# Patient Record
Sex: Female | Born: 1995 | Hispanic: No | Marital: Married | Smoking: Never smoker
Health system: Southern US, Community
[De-identification: ages and names within clinical notes are randomized; demographics above are authoritative.]

## PROBLEM LIST (undated history)

## (undated) ENCOUNTER — Ambulatory Visit (HOSPITAL_COMMUNITY): Payer: Medicaid Other

## (undated) ENCOUNTER — Inpatient Hospital Stay (HOSPITAL_COMMUNITY): Payer: Self-pay

## (undated) DIAGNOSIS — N390 Urinary tract infection, site not specified: Secondary | ICD-10-CM

## (undated) DIAGNOSIS — D649 Anemia, unspecified: Secondary | ICD-10-CM

## (undated) DIAGNOSIS — F32A Depression, unspecified: Secondary | ICD-10-CM

## (undated) DIAGNOSIS — K219 Gastro-esophageal reflux disease without esophagitis: Secondary | ICD-10-CM

## (undated) DIAGNOSIS — R002 Palpitations: Secondary | ICD-10-CM

## (undated) DIAGNOSIS — Z789 Other specified health status: Secondary | ICD-10-CM

## (undated) DIAGNOSIS — J302 Other seasonal allergic rhinitis: Secondary | ICD-10-CM

## (undated) HISTORY — PX: NO PAST SURGERIES: SHX2092

---

## 2021-06-16 ENCOUNTER — Other Ambulatory Visit: Payer: Self-pay

## 2021-06-16 ENCOUNTER — Emergency Department (HOSPITAL_COMMUNITY)
Admission: EM | Admit: 2021-06-16 | Discharge: 2021-06-16 | Disposition: A | Discharge status: Home / Self Care | Payer: BC Managed Care – PPO | Attending: Emergency Medicine | Admitting: Emergency Medicine

## 2021-06-16 ENCOUNTER — Encounter (HOSPITAL_COMMUNITY): Payer: Self-pay

## 2021-06-16 DIAGNOSIS — R102 Pelvic and perineal pain: Secondary | ICD-10-CM | POA: Diagnosis not present

## 2021-06-16 DIAGNOSIS — Z3A01 Less than 8 weeks gestation of pregnancy: Secondary | ICD-10-CM | POA: Diagnosis not present

## 2021-06-16 DIAGNOSIS — R5383 Other fatigue: Secondary | ICD-10-CM | POA: Insufficient documentation

## 2021-06-16 DIAGNOSIS — O219 Vomiting of pregnancy, unspecified: Secondary | ICD-10-CM | POA: Diagnosis not present

## 2021-06-16 LAB — URINALYSIS, ROUTINE W REFLEX MICROSCOPIC
Bacteria, UA: NONE SEEN
Bilirubin Urine: NEGATIVE
Glucose, UA: NEGATIVE mg/dL
Leukocytes,Ua: NEGATIVE
Nitrite: NEGATIVE
Protein, ur: NEGATIVE mg/dL
Specific Gravity, Urine: >=1.03 (ref 1.005–1.030)
pH: 6 (ref 5.0–8.0)

## 2021-06-16 LAB — COMPREHENSIVE METABOLIC PANEL
ALT: 9 U/L (ref 0–44)
AST: 15 U/L (ref 15–41)
Albumin: 4 g/dL (ref 3.5–5.0)
Alkaline Phosphatase: 27 U/L — ABNORMAL LOW (ref 38–126)
Anion gap: 9 (ref 5–15)
BUN: 12 mg/dL (ref 6–20)
CO2: 21 mmol/L — ABNORMAL LOW (ref 22–32)
Calcium: 9.3 mg/dL (ref 8.9–10.3)
Chloride: 105 mmol/L (ref 98–111)
Creatinine, Ser: 0.48 mg/dL (ref 0.44–1.00)
GFR, Estimated: >60 mL/min (ref >60)
Glucose, Bld: 88 mg/dL (ref 70–99)
Potassium: 3.2 mmol/L — ABNORMAL LOW (ref 3.5–5.1)
Sodium: 135 mmol/L (ref 135–145)
Total Bilirubin: 0.5 mg/dL (ref 0.3–1.2)
Total Protein: 7.3 g/dL (ref 6.5–8.1)

## 2021-06-16 LAB — CBC WITH DIFFERENTIAL/PLATELET
Abs Immature Granulocytes: 0.01 10*3/uL (ref 0.00–0.07)
Basophils Absolute: 0.1 10*3/uL (ref 0.0–0.1)
Basophils Relative: 1 %
Eosinophils Absolute: 0.2 10*3/uL (ref 0.0–0.5)
Eosinophils Relative: 3 %
HCT: 35.8 % — ABNORMAL LOW (ref 36.0–46.0)
Hemoglobin: 11.5 g/dL — ABNORMAL LOW (ref 12.0–15.0)
Immature Granulocytes: 0 %
Lymphocytes Relative: 35 %
Lymphs Abs: 2.2 10*3/uL (ref 0.7–4.0)
MCH: 26.3 pg (ref 26.0–34.0)
MCHC: 32.1 g/dL (ref 30.0–36.0)
MCV: 81.7 fL (ref 80.0–100.0)
Monocytes Absolute: 0.5 10*3/uL (ref 0.1–1.0)
Monocytes Relative: 8 %
Neutro Abs: 3.3 10*3/uL (ref 1.7–7.7)
Neutrophils Relative %: 53 %
Platelets: 268 10*3/uL (ref 150–400)
RBC: 4.38 MIL/uL (ref 3.87–5.11)
RDW: 12.7 % (ref 11.5–15.5)
WBC: 6.2 10*3/uL (ref 4.0–10.5)
nRBC: 0 % (ref 0.0–0.2)

## 2021-06-16 LAB — LIPASE, BLOOD: Lipase: 39 U/L (ref 11–51)

## 2021-06-16 LAB — HCG, QUANTITATIVE, PREGNANCY: hCG, Beta Chain, Quant, S: 199569 m[IU]/mL — ABNORMAL HIGH (ref <5)

## 2021-06-16 MED ORDER — POTASSIUM CHLORIDE CRYS ER 20 MEQ PO TBCR
40.0000 meq | EXTENDED_RELEASE_TABLET | Freq: Once | ORAL | Status: AC
  Administered 2021-06-16: 40 meq via ORAL
  Filled 2021-06-16: qty 2

## 2021-06-16 MED ORDER — SODIUM CHLORIDE 0.9 % IV BOLUS
1000.0000 mL | Freq: Once | INTRAVENOUS | Status: AC
  Administered 2021-06-16: 1000 mL via INTRAVENOUS

## 2021-06-16 MED ORDER — ONDANSETRON HCL 4 MG/2ML IJ SOLN
4.0000 mg | Freq: Once | INTRAMUSCULAR | Status: AC
Start: 2021-06-16 — End: 2021-06-16
  Administered 2021-06-16: 4 mg via INTRAVENOUS
  Filled 2021-06-16: qty 2

## 2021-06-16 NOTE — ED Provider Notes (Signed)
[facility]-EMERGENCY DEPT Provider Note   CSN: [id number] Arrival date & time: 06/16/21  1651    History Chief Complaint  Patient presents with   Morning Sickness   Everardo All Tyesha Joffe is a 25 y.o. female. G3P2 not currently followed by ObGyn who presents for evaluation of nausea and fatigue. Has been dry heaving for 1 month. Feels very fatigued. Triage note, notes intermittent abd pain however patient denies any current pain. States had some cramping 2 weeks ago which is resolved. No vaginal bleeding, vag discharge.  No fever, chills, chest pain, shortness of breath abdominal pain, pelvic pain, numbness, syncope. Denies additional aggravating or alleviating factors.  History obtained from patient and past medical records. Patient declines medical Arabic interpretor. Request her husband interpret.  LMP 05/02/21  HPI     History reviewed. No pertinent past medical history.  There are no problems to display for this patient.   History reviewed. No pertinent surgical history.   OB History     Gravida  1   Para      Term      Preterm      AB      Living         SAB      IAB      Ectopic      Multiple      Live Births              History reviewed. No pertinent family history.  Social History   Tobacco Use   Smoking status: Never   Smokeless tobacco: Never  Substance Use Topics   Alcohol use: Never   Drug use: Never    Home Medications Prior to Admission medications   Not on File    Allergies    Patient has no known allergies.  Review of Systems   Review of Systems  Constitutional:  Positive for fatigue. Negative for activity change, appetite change, chills, diaphoresis, fever and unexpected weight change.  HENT: Negative.    Respiratory: Negative.    Cardiovascular: Negative.   Gastrointestinal:  Positive for nausea. Negative for abdominal distention, abdominal pain, anal bleeding, blood in stool,  constipation, diarrhea, rectal pain and vomiting.  Genitourinary: Negative.   Musculoskeletal: Negative.   Skin: Negative.   Neurological: Negative.   All other systems reviewed and are negative.  Physical Exam Updated Vital Signs BP (!) 95/59   Pulse 67   Temp 98.2 F (36.8 C) (Oral)   Resp 18   LMP 05/02/2021   SpO2 100%   Physical Exam Vitals and nursing note reviewed.  Constitutional:      General: She is not in acute distress.    Appearance: She is well-developed. She is not ill-appearing, toxic-appearing or diaphoretic.  HENT:     Head: Normocephalic and atraumatic.     Mouth/Throat:     Mouth: Mucous membranes are moist.  Eyes:     Pupils: Pupils are equal, round, and reactive to light.  Cardiovascular:     Rate and Rhythm: Normal rate.     Pulses: Normal pulses.     Heart sounds: Normal heart sounds.  Pulmonary:     Effort: Pulmonary effort is normal. No respiratory distress.     Breath sounds: Normal breath sounds.  Abdominal:     General: Bowel sounds are normal. There is no distension.     Palpations: Abdomen is soft.     Tenderness: There is no abdominal tenderness. There is  no right CVA tenderness, left CVA tenderness, guarding or rebound.  Genitourinary:    Comments: DECLINES Musculoskeletal:        General: Normal range of motion.     Cervical back: Normal range of motion.  Skin:    General: Skin is warm and dry.     Capillary Refill: Capillary refill takes less than 2 seconds.  Neurological:     General: No focal deficit present.     Mental Status: She is alert and oriented to person, place, and time.  Psychiatric:        Mood and Affect: Mood normal.    ED Results / Procedures / Treatments   Labs (all labs ordered are listed, but only abnormal results are displayed) Labs Reviewed  CBC WITH DIFFERENTIAL/PLATELET - Abnormal; Notable for the following components:      Result Value   Hemoglobin 11.5 (*)    HCT 35.8 (*)    All other components  within normal limits  COMPREHENSIVE METABOLIC PANEL - Abnormal; Notable for the following components:   Potassium 3.2 (*)    CO2 21 (*)    Alkaline Phosphatase 27 (*)    All other components within normal limits  HCG, QUANTITATIVE, PREGNANCY - Abnormal; Notable for the following components:   hCG, Beta Chain, Quant, S [id number] (*)    All other components within normal limits  URINALYSIS, ROUTINE W REFLEX MICROSCOPIC - Abnormal; Notable for the following components:   Color, Urine YELLOW (*)    APPearance CLEAR (*)    Hgb urine dipstick SMALL (*)    Ketones, ur TRACE (*)    All other components within normal limits  LIPASE, BLOOD    EKG None  Radiology No results found.  Procedures Procedures   Medications Ordered in ED Medications  sodium chloride 0.9 % bolus 1,000 mL (1,000 mLs Intravenous New Bag/Given (Non-Interop) 06/16/21 2015)  ondansetron (ZOFRAN) injection 4 mg (4 mg Intravenous Given 06/16/21 2017)  potassium chloride SA (KLOR-CON) CR tablet 40 mEq (40 mEq Oral Given 06/16/21 2115)   ED Course  I have reviewed the triage vital signs and the nursing notes.  Pertinent labs & imaging results that were available during my care of the patient were reviewed by me and considered in my medical decision making (see chart for details).  G3 P2 approximately [redacted]w[redacted]d preg by LMP since for evaluation of generalized fatigue and nausea over the last 3 weeks.  Afebrile, nonseptic, not ill-appearing.  Denies any current abdominal pain, vaginal bleeding, fluid leakage. Initially had cramping 2 weeks ago however resolved.  No urinary complaints.  Not currently followed by OB.  Labs personally reviewed and interpreted:  CBC without leukocytosis, hemoglobin 11.5 Lipase 39 Metabolic panel with potassium 3.2, will supplement UA negative for infection hCG [id number] [place] >>> DECLINED  Patient reassessed.  Nausea improved.  Requesting drink.  Will p.o. challenge.  Discussed a pelvic exam and  ultrasound given she had some earlier cramping.  Patient declines any ultrasound or pelvic exam at this time.  States they prefer to follow-up with OB/GYN.  I had long discussion with husband and patient in room with interpreter discussed cannot rule out ectopic pregnancy, complication of pregnancy without ultrasound.  They voiced understanding however continued to decline.  Reassessed. Tolerated PO intake. Will dc home with close FU with ObGYn. Dc home with Unasom and B6 rec for nausea, rest and push fluids. Discussed strict return precautions. Patient agreeable.  The patient has been appropriately medically screened and/or  stabilized in the ED. I have low suspicion for any other emergent medical condition which would require further screening, evaluation or treatment in the ED or require inpatient management.  Patient is hemodynamically stable and in no acute distress.  Patient able to ambulate in department prior to ED.  Evaluation does not show acute pathology that would require ongoing or additional emergent interventions while in the emergency department or further inpatient treatment.  I have discussed the diagnosis with the patient and answered all questions.  Pain is been managed while in the emergency department and patient has no further complaints prior to discharge.  Patient is comfortable with plan discussed in room and is stable for discharge at this time.  I have discussed strict return precautions for returning to the emergency department.  Patient was encouraged to follow-up with PCP/specialist refer to at discharge.     MDM Rules/Calculators/A&P                            Final Clinical Impression(s) / ED Diagnoses Final diagnoses:  Nausea/vomiting in pregnancy    Rx / DC Orders ED Discharge Orders          Ordered    Ambulatory referral to Obstetrics / Gynecology        06/16/21 2103             Linwood Dibbles, PA-C 06/16/21 2131    Charlynne Pander,  MD 06/16/21 2333

## 2021-06-16 NOTE — ED Provider Notes (Signed)
Emergency Medicine Provider Triage Evaluation Note  Melanie [name] , a 25 y.o. female  was evaluated in triage.  Pt complains of nausea and fatigue x3 weeks.  Patient is currently 1 month pregnant.  Husband at bedside translated.  No vaginal bleeding.  Intermittent abdominal pain.  She has not seen an OB/GYN during this pregnancy yet. G3P2.  No emesis.  No fever or chills.  No urinary symptoms.  Review of Systems  Positive: Nausea, fatigue Negative: Vaginal bleeding  Physical Exam  BP 105/62 (BP Location: Left Arm)   Pulse 89   Temp 98.2 F (36.8 C) (Oral)   Resp 18   LMP 05/02/2021   SpO2 97%  Gen:   Awake, no distress   Resp:  Normal effort  MSK:   Moves extremities without difficulty  Other:  Abdomen soft, nondistended, nontender to palpation in all quadrants without guarding or peritoneal signs. No rebound.   Medical Decision Making  Medically screening exam initiated at 5:27 PM.  Appropriate orders placed.  Melanie [name] was informed that the remainder of the evaluation will be completed by another provider, this initial triage assessment does not replace that evaluation, and the importance of remaining in the ED until their evaluation is complete.  labs   Melanie [name] 06/16/21 1728    Melanie Score, MD 06/16/21 [phone]

## 2021-06-16 NOTE — ED Notes (Signed)
Pt given water and crackers for PO challenge

## 2021-06-16 NOTE — Discharge Instructions (Addendum)
???????.  ???????? ?? ???? ??????. ??? ????? ????? ?? ?????????. ?? ??????? ?? ????? ?????? ?????? ?????? ????.  ?????? ??????? ??????? ?? ?????????   tanawal al'adwiat ealaa alnahw almusufa. almutabaeat mae tabib alnisa'i. laqad 'ujriat 'iihalatan fi alkumbiutar. min almuftarad 'an tatalaqaa mukalamatan hatifiatan litahdid maweidi. aleawdat lil'aerad aljadidat 'aw almutafaqima  Take the medications as prescribed.  Follow up with an Obgyn. I have placed a referal in the computer. You should be getting a phone call to schedule an appointment.  Return for new or worsening symptoms  Vitamin B6>> 10 mg up to 3 times daily Unisom 12.5 mg (1/2 tablet) at bedtime

## 2021-06-16 NOTE — ED Triage Notes (Signed)
Pt arrives with her husband. She requests for him to interpret for her. Pt reports nausea and fatigue for the past 3 weeks. She reports she about 1 month pregnant.

## 2021-06-21 ENCOUNTER — Other Ambulatory Visit: Payer: Self-pay

## 2021-06-21 ENCOUNTER — Emergency Department (HOSPITAL_COMMUNITY)
Admission: EM | Admit: 2021-06-21 | Discharge: 2021-06-22 | Disposition: A | Discharge status: Home / Self Care | Payer: BC Managed Care – PPO | Attending: Emergency Medicine | Admitting: Emergency Medicine

## 2021-06-21 DIAGNOSIS — O219 Vomiting of pregnancy, unspecified: Secondary | ICD-10-CM | POA: Insufficient documentation

## 2021-06-21 DIAGNOSIS — Z3A01 Less than 8 weeks gestation of pregnancy: Secondary | ICD-10-CM | POA: Diagnosis not present

## 2021-06-21 DIAGNOSIS — Z5321 Procedure and treatment not carried out due to patient leaving prior to being seen by health care provider: Secondary | ICD-10-CM | POA: Insufficient documentation

## 2021-06-21 LAB — COMPREHENSIVE METABOLIC PANEL
ALT: 10 U/L (ref 0–44)
AST: 16 U/L (ref 15–41)
Albumin: 4 g/dL (ref 3.5–5.0)
Alkaline Phosphatase: 30 U/L — ABNORMAL LOW (ref 38–126)
Anion gap: 6 (ref 5–15)
BUN: 12 mg/dL (ref 6–20)
CO2: 23 mmol/L (ref 22–32)
Calcium: 9.2 mg/dL (ref 8.9–10.3)
Chloride: 105 mmol/L (ref 98–111)
Creatinine, Ser: 0.75 mg/dL (ref 0.44–1.00)
GFR, Estimated: >60 mL/min (ref >60)
Glucose, Bld: 126 mg/dL — ABNORMAL HIGH (ref 70–99)
Potassium: 3.2 mmol/L — ABNORMAL LOW (ref 3.5–5.1)
Sodium: 134 mmol/L — ABNORMAL LOW (ref 135–145)
Total Bilirubin: 0.6 mg/dL (ref 0.3–1.2)
Total Protein: 7.2 g/dL (ref 6.5–8.1)

## 2021-06-21 LAB — CBC WITH DIFFERENTIAL/PLATELET
Abs Immature Granulocytes: 0.02 10*3/uL (ref 0.00–0.07)
Basophils Absolute: 0.1 10*3/uL (ref 0.0–0.1)
Basophils Relative: 1 %
Eosinophils Absolute: 0.1 10*3/uL (ref 0.0–0.5)
Eosinophils Relative: 3 %
HCT: 34.5 % — ABNORMAL LOW (ref 36.0–46.0)
Hemoglobin: 11.1 g/dL — ABNORMAL LOW (ref 12.0–15.0)
Immature Granulocytes: 0 %
Lymphocytes Relative: 29 %
Lymphs Abs: 1.6 10*3/uL (ref 0.7–4.0)
MCH: 26.6 pg (ref 26.0–34.0)
MCHC: 32.2 g/dL (ref 30.0–36.0)
MCV: 82.5 fL (ref 80.0–100.0)
Monocytes Absolute: 0.4 10*3/uL (ref 0.1–1.0)
Monocytes Relative: 7 %
Neutro Abs: 3.4 10*3/uL (ref 1.7–7.7)
Neutrophils Relative %: 60 %
Platelets: 269 10*3/uL (ref 150–400)
RBC: 4.18 MIL/uL (ref 3.87–5.11)
RDW: 12.7 % (ref 11.5–15.5)
WBC: 5.7 10*3/uL (ref 4.0–10.5)
nRBC: 0 % (ref 0.0–0.2)

## 2021-06-21 NOTE — ED Provider Notes (Signed)
Emergency Medicine Provider Triage Evaluation Note  Melanie [name] , a 25 y.o. female  was evaluated in triage.  Pt complains of creased nausea.  Of note patient is 5 or [redacted] weeks pregnant.  Has been unable to set up appointment with OB GYN, called to make an appointment but was sent here instead.  Patient's husband who acts as Nurse, learning disability states that patient has been unable to eat for several days that she has had several episodes of vomiting.  Review of Systems  Positive: Nausea, vomiting Negative: Fevers, chills, chest pain, shortness of breath  Physical Exam  BP 95/64 (BP Location: Left Arm)   Pulse 78   Temp 97.8 F (36.6 C) (Oral)   Resp 16   Ht 5\' 1"  (1.549 m)   Wt 47.1 kg   LMP 05/02/2021   SpO2 100%   BMI 19.61 kg/m  Gen:   Awake, no distress  Resp:  Normal effort MSK:   Moves extremities without difficulty    Medical Decision Making  Medically screening exam initiated at 6:33 PM.  Appropriate orders placed.  05/04/2021 was informed that the remainder of the evaluation will be completed by another provider, this initial triage assessment does not replace that evaluation, and the importance of remaining in the ED until their evaluation is complete.     [name] 06/21/21 06/23/21, MD 06/22/21 [phone]

## 2021-06-21 NOTE — ED Triage Notes (Signed)
Pt requests husband to be her interpreter. Pt reports increased nausea and vomiting with her pregnancy. Pt was seen on the 11th for the same. She is approximately 5-6 weeks pregant.

## 2021-06-22 NOTE — ED Notes (Signed)
Pt left with husband and 2 children prior to being seen by physician

## 2021-07-22 ENCOUNTER — Other Ambulatory Visit: Payer: Self-pay

## 2021-07-22 ENCOUNTER — Ambulatory Visit (INDEPENDENT_AMBULATORY_CARE_PROVIDER_SITE_OTHER): Payer: BC Managed Care – PPO

## 2021-07-22 VITALS — BP 101/69 | HR 91 | Temp 98.0°F | Wt 104.6 lb

## 2021-07-22 DIAGNOSIS — O219 Vomiting of pregnancy, unspecified: Secondary | ICD-10-CM

## 2021-07-22 DIAGNOSIS — Z3480 Encounter for supervision of other normal pregnancy, unspecified trimester: Secondary | ICD-10-CM

## 2021-07-22 DIAGNOSIS — Z3490 Encounter for supervision of normal pregnancy, unspecified, unspecified trimester: Secondary | ICD-10-CM | POA: Insufficient documentation

## 2021-07-22 DIAGNOSIS — Z3481 Encounter for supervision of other normal pregnancy, first trimester: Secondary | ICD-10-CM

## 2021-07-22 DIAGNOSIS — Z3491 Encounter for supervision of normal pregnancy, unspecified, first trimester: Secondary | ICD-10-CM

## 2021-07-22 DIAGNOSIS — Z3A11 11 weeks gestation of pregnancy: Secondary | ICD-10-CM

## 2021-07-22 DIAGNOSIS — R300 Dysuria: Secondary | ICD-10-CM

## 2021-07-22 LAB — POCT URINALYSIS DIPSTICK OB
Glucose, UA: NEGATIVE
Nitrite, UA: NEGATIVE
Spec Grav, UA: >=1.03 — AB (ref 1.010–1.025)
Urobilinogen, UA: 0.2 E.U./dL
pH, UA: 6 (ref 5.0–8.0)

## 2021-07-22 LAB — HEPATITIS C ANTIBODY: HCV Ab: NEGATIVE

## 2021-07-22 MED ORDER — PREPLUS 27-1 MG PO TABS: 1.0000 | ORAL_TABLET | Freq: Every day | ORAL | 13 refills | Status: DC

## 2021-07-22 MED ORDER — PROMETHAZINE HCL 25 MG PO TABS: 25.0000 mg | ORAL_TABLET | Freq: Four times a day (QID) | ORAL | 0 refills | Status: DC | PRN

## 2021-07-22 NOTE — Progress Notes (Signed)
    I discussed the limitations, risks, security and privacy concerns of performing an evaluation and management service by telephone and the availability of in person appointments. I also discussed with the patient that there may be a patient responsible charge related to this service. The patient expressed understanding and agreed to proceed.   Location: [facility]  Patient: clinic Provider: clinic Interpreter used: a video interpreter ANM (Husam [id number])  PRENATAL INTAKE SUMMARY  Ms. Melanie [name] presents today New OB Nurse Interview.  OB History     Gravida  3   Para  2   Term  2   Preterm      AB      Living  2      SAB      IAB      Ectopic      Multiple      Live Births  2          I have reviewed the patient's medical, obstetrical, social, and family histories, medications, and available lab results.  SUBJECTIVE She complains of burning while urinating, and Nausea and Vomiting   OBJECTIVE Initial Nurse interview for history/labs (New OB)  Vital signs were reviewed and unremarkable.  last menstrual period date: 05/02/2021 EDD: 02/06/2022  GA: [redacted]w[redacted]d [id number] FHT: 155  GENERAL APPEARANCE: alert, well appearing, in no apparent distress, oriented to person, place and time   ASSESSMENT Normal pregnancy  PLAN Prenatal care:  [facility] Pnl/HIV/Hep C OB Urine Culture/disptick GC/CT will obtain at next visit with Provider (08/07/2021) PAPpatient has never had a pap test  A1C Declined genetic screening Phenergan and Prenatal sent to pharmacy    Blood Pressure Monitor/Weight Scale In person visits    MyChart/Babyscripts In person visits  Placed OB Box on problem list and updated   Followup with Gerrit Heck, CNM 08/07/2021  Follow Up Instructions:   I discussed the assessment and treatment plan with the patient. The patient was provided an opportunity to ask questions and all were answered. The patient agreed with the plan and  demonstrated an understanding of the instructions.   The patient was advised to call back or seek an in-person evaluation if the symptoms worsen or if the condition fails to improve as anticipated.  I provided 40 minutes of  face-to-face time during this encounter.  Gearldine Shown, CMA

## 2021-07-23 LAB — OBSTETRIC PANEL, INCLUDING HIV
Antibody Screen: NEGATIVE
Basophils Absolute: 0.1 10*3/uL (ref 0.0–0.2)
Basos: 1 %
EOS (ABSOLUTE): 0.2 10*3/uL (ref 0.0–0.4)
Eos: 4 %
HIV Screen 4th Generation wRfx: NONREACTIVE
Hematocrit: 35.3 % (ref 34.0–46.6)
Hemoglobin: 11.5 g/dL (ref 11.1–15.9)
Hepatitis B Surface Ag: NEGATIVE
Immature Grans (Abs): 0 10*3/uL (ref 0.0–0.1)
Immature Granulocytes: 0 %
Lymphocytes Absolute: 1.7 10*3/uL (ref 0.7–3.1)
Lymphs: 35 %
MCH: 26.4 pg — ABNORMAL LOW (ref 26.6–33.0)
MCHC: 32.6 g/dL (ref 31.5–35.7)
MCV: 81 fL (ref 79–97)
Monocytes Absolute: 0.3 10*3/uL (ref 0.1–0.9)
Monocytes: 7 %
Neutrophils Absolute: 2.5 10*3/uL (ref 1.4–7.0)
Neutrophils: 53 %
Platelets: 248 10*3/uL (ref 150–450)
RBC: 4.35 x10E6/uL (ref 3.77–5.28)
RDW: 12.9 % (ref 11.7–15.4)
RPR Ser Ql: NONREACTIVE
Rh Factor: POSITIVE
Rubella Antibodies, IGG: 3.65 index (ref >0.99)
WBC: 4.7 10*3/uL (ref 3.4–10.8)

## 2021-07-23 LAB — HEMOGLOBIN A1C
Est. average glucose Bld gHb Est-mCnc: 105 mg/dL
Hgb A1c MFr Bld: 5.3 % (ref 4.8–5.6)

## 2021-07-23 LAB — HEPATITIS C ANTIBODY: Hep C Virus Ab: <0.1 s/co ratio (ref 0.0–0.9)

## 2021-07-26 LAB — URINE CULTURE, OB REFLEX

## 2021-07-26 LAB — CULTURE, OB URINE

## 2021-07-28 ENCOUNTER — Other Ambulatory Visit: Payer: Self-pay

## 2021-07-28 DIAGNOSIS — R8271 Bacteriuria: Secondary | ICD-10-CM | POA: Insufficient documentation

## 2021-07-28 DIAGNOSIS — Z8619 Personal history of other infectious and parasitic diseases: Secondary | ICD-10-CM | POA: Insufficient documentation

## 2021-07-28 MED ORDER — AMOXICILLIN 500 MG PO CAPS: 500.0000 mg | ORAL_CAPSULE | Freq: Three times a day (TID) | ORAL | 0 refills | Status: AC

## 2021-07-31 ENCOUNTER — Telehealth: Payer: Self-pay | Admitting: *Deleted

## 2021-07-31 NOTE — Telephone Encounter (Signed)
-----   Message from Gerrit Heck, [place] sent at 07/28/2021  6:29 AM EDT ----- Please call patient and inform of results.  Rx sent to pharmacy on file.   Thanks, JE

## 2021-07-31 NOTE — Telephone Encounter (Signed)
Patient called regarding culture results. DOB verified. Advised of +UTI (GBS bacteriuria). Amoxicillin 500 mg capsule TID PO x 7 days. Advised to complete full course of medication.    Interpreter: [place] ID [id number]  Clovis Pu, RN

## 2021-08-07 ENCOUNTER — Other Ambulatory Visit: Payer: Self-pay

## 2021-08-07 ENCOUNTER — Other Ambulatory Visit (HOSPITAL_COMMUNITY)
Admission: RE | Admit: 2021-08-07 | Discharge: 2021-08-07 | Disposition: A | Discharge status: Home / Self Care | Payer: BC Managed Care – PPO | Source: Ambulatory Visit

## 2021-08-07 ENCOUNTER — Ambulatory Visit (INDEPENDENT_AMBULATORY_CARE_PROVIDER_SITE_OTHER): Payer: BC Managed Care – PPO

## 2021-08-07 VITALS — BP 103/71 | HR 93 | Temp 97.7°F | Wt 110.8 lb

## 2021-08-07 DIAGNOSIS — Z124 Encounter for screening for malignant neoplasm of cervix: Secondary | ICD-10-CM

## 2021-08-07 DIAGNOSIS — Z3A13 13 weeks gestation of pregnancy: Secondary | ICD-10-CM | POA: Diagnosis not present

## 2021-08-07 DIAGNOSIS — Z3480 Encounter for supervision of other normal pregnancy, unspecified trimester: Secondary | ICD-10-CM

## 2021-08-07 DIAGNOSIS — Z3A38 38 weeks gestation of pregnancy: Secondary | ICD-10-CM | POA: Diagnosis not present

## 2021-08-07 DIAGNOSIS — Z3481 Encounter for supervision of other normal pregnancy, first trimester: Secondary | ICD-10-CM | POA: Insufficient documentation

## 2021-08-07 DIAGNOSIS — Z789 Other specified health status: Secondary | ICD-10-CM

## 2021-08-07 DIAGNOSIS — R8271 Bacteriuria: Secondary | ICD-10-CM | POA: Diagnosis not present

## 2021-08-07 DIAGNOSIS — O99013 Anemia complicating pregnancy, third trimester: Secondary | ICD-10-CM | POA: Diagnosis not present

## 2021-08-07 NOTE — Progress Notes (Signed)
Subjective:   Melanie [name] is a 25 y.o. [id number] at [redacted]w[redacted]d by Unsure LMP of May 02, 2021 being seen today for her first obstetrical visit.  Her obstetrical history is unremarkable per patient report. Patient does intend to breast feed. Pregnancy history fully reviewed.  Patient reports no complaints.  HISTORY: OB History  Gravida Para Term Preterm AB Living  3 2 2  0 0 2  SAB IAB Ectopic Multiple Live Births  0 0 0 0 2    # Outcome Date GA Lbr Len/2nd Weight Sex Delivery Anes PTL Lv  3 Current           2 Term           1 Term            No past medical history on file. No past surgical history on file. Family History  Problem Relation Age of Onset   Liver disease Mother    Hypertension Father    Social History   Tobacco Use   Smoking status: Never   Smokeless tobacco: Never  Vaping Use   Vaping Use: Never used  Substance Use Topics   Alcohol use: Never   Drug use: Never   No Known Allergies Current Outpatient Medications on File Prior to Visit  Medication Sig Dispense Refill   Prenatal Vit-Fe Fumarate-FA (PREPLUS) 27-1 MG TABS Take 1 tablet by mouth daily. 30 tablet 13   No current facility-administered medications on file prior to visit.    Indications for ASA therapy (per uptodate) One of the following: Previous pregnancy with preeclampsia, especially early onset and with an adverse outcome No Multifetal gestation No Chronic hypertension No Type 1 or 2 diabetes mellitus No Chronic kidney disease No Autoimmune disease (antiphospholipid syndrome, systemic lupus erythematosus) No  Two or more of the following: Nulliparity No Obesity (body mass index >30 kg/m2) No Family history of preeclampsia in mother or sister No Age ?35 years No Sociodemographic characteristics (African American race, low socioeconomic level) No Personal risk factors (eg, previous pregnancy with low birth weight or small for gestational age infant, previous adverse  pregnancy outcome [eg, stillbirth], interval >10 years between pregnancies) No  Indications for early 1 hour GTT (per uptodate)  BMI >25 (>23 in Asian women) AND one of the following  Gestational diabetes mellitus in a previous pregnancy No Glycated hemoglobin ?5.7 percent (39 mmol/mol), impaired glucose tolerance, or impaired fasting glucose on previous testing No First-degree relative with diabetes No High-risk race/ethnicity (eg, African American, Latino, Native American, American, Pacific Islander) No History of cardiovascular disease No Hypertension or on therapy for hypertension No High-density lipoprotein cholesterol level <35 mg/dL ([place] mmol/L) and/or a triglyceride level >250 mg/dL (9.21 mmol/L) No Polycystic ovary syndrome No Physical inactivity No Other clinical condition associated with insulin resistance (eg, severe obesity, acanthosis nigricans) No Previous birth of an infant weighing ?4000 g No Previous stillbirth of unknown cause No   Exam   Vitals:   08/07/21 1059  BP: 103/71  Pulse: 93  Temp: 97.7 F (36.5 C)  Weight: 110 lb 12.8 oz (50.3 kg)   Fetal Heart Rate (bpm): 146  Uterus:     Pelvic Exam: Perineum: no hemorrhoids, normal perineum   Vulva: normal external genitalia, no lesions, Mucoid discharge noted   Vagina:  normal mucosa, Copious amt mucoid discharge-seminal fluid   Cervix: no lesions and normal, pap smear done.    Adnexa: normal adnexa and no mass, fullness, tenderness. Uterine size c/w  dates   Bony Pelvis: average  System: General: well-developed, well-nourished female in no acute distress   Breast:  Not assessed   Skin: normal coloration and turgor, no rashes   Neurologic: oriented, normal, negative, normal mood   Extremities: normal strength, tone, and muscle mass, ROM of all joints is normal   HEENT PERRLA, extraocular movement intact and sclera clear, anicteric   Mouth/Teeth Not Assessed   Neck Hijab in place, Not assessed    Cardiovascular: regular rate and rhythm   Respiratory:  no respiratory distress, normal breath sounds   Abdomen: soft, non-tender; bowel sounds normal; no masses,  no organomegaly     Assessment:   Pregnancy: [id number] Patient Active Problem List   Diagnosis Date Noted   GBS bacteriuria 07/28/2021   Encounter for supervision of normal pregnancy 07/22/2021     Plan:  1. Supervision of other normal pregnancy, antepartum -Reviewed prenatal schedule -Discussed hospital location and when to report.  - Cytology - PAP( [place])  2. Pap smear for cervical cancer screening -Completed - Cytology - PAP( [place])  3. Language barrier affecting health care -Interpretations completed with assistance of video interpreter [place]. -Live interpreter arrives 3/4 way into visit and present for exam. Salma  4. [redacted] weeks gestation of pregnancy -Doing well.   Initial labs reviewed. Continue prenatal vitamins. Genetic Screening discussed, Quad screen and NIPS: declined. Ultrasound discussed; fetal anatomic survey: ordered. Problem list reviewed and updated. The nature of [place] with multiple MDs and other Advanced Practice Providers was explained to patient; also emphasized that residents, students are part of our team. Routine obstetric precautions reviewed. No follow-ups on file.   Maryann Conners 11:09 AM 08/07/21

## 2021-08-09 LAB — CYTOLOGY - PAP
Chlamydia: NEGATIVE
Comment: NEGATIVE
Comment: NEGATIVE
Comment: NORMAL
Diagnosis: NEGATIVE
Neisseria Gonorrhea: NEGATIVE
Trichomonas: NEGATIVE

## 2021-09-04 ENCOUNTER — Other Ambulatory Visit: Payer: Self-pay

## 2021-09-04 ENCOUNTER — Ambulatory Visit (INDEPENDENT_AMBULATORY_CARE_PROVIDER_SITE_OTHER): Payer: BC Managed Care – PPO | Admitting: Advanced Practice Midwife

## 2021-09-04 ENCOUNTER — Encounter: Payer: Self-pay | Admitting: Advanced Practice Midwife

## 2021-09-04 VITALS — BP 94/56 | HR 93 | Temp 98.3°F | Wt 114.2 lb

## 2021-09-04 DIAGNOSIS — Z3A17 17 weeks gestation of pregnancy: Secondary | ICD-10-CM

## 2021-09-04 DIAGNOSIS — J302 Other seasonal allergic rhinitis: Secondary | ICD-10-CM

## 2021-09-04 DIAGNOSIS — Z3480 Encounter for supervision of other normal pregnancy, unspecified trimester: Secondary | ICD-10-CM

## 2021-09-04 DIAGNOSIS — R1011 Right upper quadrant pain: Secondary | ICD-10-CM

## 2021-09-04 DIAGNOSIS — O26899 Other specified pregnancy related conditions, unspecified trimester: Secondary | ICD-10-CM

## 2021-09-04 MED ORDER — LORATADINE 10 MG PO TABS: 10.0000 mg | ORAL_TABLET | Freq: Every day | ORAL | 1 refills | Status: DC

## 2021-09-04 NOTE — Progress Notes (Signed)
   PRENATAL VISIT NOTE  Subjective:  Melanie [name] is a 25 y.o. G3P2002 at [redacted]w[redacted]d being seen today for ongoing prenatal care.  She is currently monitored for the following issues for this low-risk pregnancy and has Encounter for supervision of normal pregnancy and GBS bacteriuria on their problem list.  Patient reports no complaints.  Contractions: Not present. Vag. Bleeding: None.  Movement: Absent. Denies leaking of fluid.   The following portions of the patient's history were reviewed and updated as appropriate: allergies, current medications, past family history, past medical history, past social history, past surgical history and problem list.   Objective:   Vitals:   09/04/21 1452  BP: (!) 94/56  Pulse: 93  Temp: 98.3 F (36.8 C)  Weight: 51.8 kg    Fetal Status: Fetal Heart Rate (bpm): 148   Movement: Absent     General:  Alert, oriented and cooperative. Patient is in no acute distress.  Skin: Skin is warm and dry. No rash noted.   Cardiovascular: Normal heart rate noted  Respiratory: Normal respiratory effort, no problems with respiration noted  Abdomen: Soft, gravid, appropriate for gestational age.  Pain/Pressure: Absent     Pelvic: Cervical exam deferred        Extremities: Normal range of motion.  Edema: None  Mental Status: Normal mood and affect. Normal behavior. Normal judgment and thought content.   Assessment and Plan:  Pregnancy: G3P2002 at [redacted]w[redacted]d 1. Supervision of other normal pregnancy, antepartum   2. [redacted] weeks gestation of pregnancy   Preterm labor symptoms and general obstetric precautions including but not limited to vaginal bleeding, contractions, leaking of fluid and fetal movement were reviewed in detail with the patient. Please refer to After Visit Summary for other counseling recommendations.   Return in about 4 weeks (around 10/02/2021).  Future Appointments  Date Time Provider Department Center  09/12/2021 10:15 AM [facility]  [facility]  10/02/2021  8:55 AM Raelyn Mora, CNM [facility] None   Thressa Sheller DNP, CNM  09/04/21  3:29 PM

## 2021-09-05 LAB — COMPREHENSIVE METABOLIC PANEL
ALT: 8 IU/L (ref 0–32)
AST: 17 IU/L (ref 0–40)
Albumin/Globulin Ratio: 1.5 (ref 1.2–2.2)
Albumin: 3.7 g/dL — ABNORMAL LOW (ref 3.9–5.0)
Alkaline Phosphatase: 40 IU/L — ABNORMAL LOW (ref 44–121)
BUN/Creatinine Ratio: 15 (ref 9–23)
BUN: 6 mg/dL (ref 6–20)
Bilirubin Total: <0.2 mg/dL (ref 0.0–1.2)
CO2: 21 mmol/L (ref 20–29)
Calcium: 8.8 mg/dL (ref 8.7–10.2)
Chloride: 105 mmol/L (ref 96–106)
Creatinine, Ser: 0.4 mg/dL — ABNORMAL LOW (ref 0.57–1.00)
Globulin, Total: 2.4 g/dL (ref 1.5–4.5)
Glucose: 90 mg/dL (ref 70–99)
Potassium: 3.6 mmol/L (ref 3.5–5.2)
Sodium: 138 mmol/L (ref 134–144)
Total Protein: 6.1 g/dL (ref 6.0–8.5)
eGFR: 139 mL/min/{1.73_m2} (ref >59)

## 2021-09-06 LAB — AFP, SERUM, OPEN SPINA BIFIDA
AFP MoM: 0.68
AFP Value: 32.9 ng/mL
Gest. Age on Collection Date: 17 weeks
Maternal Age At EDD: 27.6 yr
OSBR Risk 1 IN: 10000
Test Results:: NEGATIVE
Weight: 114 [lb_av]

## 2021-09-12 ENCOUNTER — Other Ambulatory Visit: Payer: Self-pay | Admitting: *Deleted

## 2021-09-12 ENCOUNTER — Other Ambulatory Visit: Payer: Self-pay

## 2021-09-12 ENCOUNTER — Ambulatory Visit: Payer: BC Managed Care – PPO | Attending: Maternal & Fetal Medicine

## 2021-09-12 DIAGNOSIS — Z789 Other specified health status: Secondary | ICD-10-CM | POA: Insufficient documentation

## 2021-09-12 DIAGNOSIS — Z124 Encounter for screening for malignant neoplasm of cervix: Secondary | ICD-10-CM | POA: Insufficient documentation

## 2021-09-12 DIAGNOSIS — Z3A13 13 weeks gestation of pregnancy: Secondary | ICD-10-CM | POA: Diagnosis present

## 2021-09-12 DIAGNOSIS — Z362 Encounter for other antenatal screening follow-up: Secondary | ICD-10-CM

## 2021-09-12 DIAGNOSIS — Z3480 Encounter for supervision of other normal pregnancy, unspecified trimester: Secondary | ICD-10-CM | POA: Insufficient documentation

## 2021-09-30 ENCOUNTER — Other Ambulatory Visit: Payer: Self-pay | Admitting: Advanced Practice Midwife

## 2021-09-30 DIAGNOSIS — J302 Other seasonal allergic rhinitis: Secondary | ICD-10-CM

## 2021-09-30 DIAGNOSIS — Z3A17 17 weeks gestation of pregnancy: Secondary | ICD-10-CM

## 2021-09-30 DIAGNOSIS — Z3480 Encounter for supervision of other normal pregnancy, unspecified trimester: Secondary | ICD-10-CM

## 2021-10-02 ENCOUNTER — Encounter: Payer: BC Managed Care – PPO | Admitting: Obstetrics and Gynecology

## 2021-10-02 ENCOUNTER — Telehealth: Payer: Self-pay | Admitting: Obstetrics and Gynecology

## 2021-10-02 NOTE — Telephone Encounter (Signed)
Patient came in the office and was coughing really bad. She was informed she needed to go get tested for the flu or COVID. I called back with an Interpreter to make sure she understood she should get tested per the nurse request.

## 2021-10-06 NOTE — L&D Delivery Note (Signed)
OB/GYN Faculty Practice Delivery Note ? ?Melanie [name] is a 26 y.o. [id number] s/p SVD at [redacted]w[redacted]d. She was admitted for SOL.  ? ?ROM: 0h 24m with clear fluid ?GBS Status: Positive/-- (03/30 0000) ?Maximum Maternal Temperature: 98.47F ? ?Labor Progress: ?Initial SVE: 6/80/-1. She then progressed to complete with expectant management.  ? ?Delivery Date/Time: 4/21 at 0908  ?Delivery: Called to room as patient was involuntarily pushing. After a few pushes, the head was delivered L OA. No nuchal cord present. Shoulder and body delivered in usual fashion. Infant with spontaneous cry, placed on mother's abdomen, dried and stimulated. Cord clamped x 2 after 1-minute delay, and cut by RN student. Cord blood drawn. Placenta delivered spontaneously with gentle cord traction. Fundus firm with massage and Pitocin. Labia, perineum, vagina, and cervix inspected with no lacerations.  ?Baby Weight: pending ? ?Placenta: 3 vessel, intact. Sent to L&D ?Complications: None ?Lacerations: None ?EBL: 300 mL ?Analgesia: None  ? ?Infant:  ?APGAR (1 MIN): 9   ?APGAR (5 MINS): 9   ? ?Darrelyn Hillock, DO  ?[facility] Family Medicine Fellow, Faculty Practice ?Center for [place] ?01/24/2022, 10:32 AM ? ? ? ?  ?

## 2021-10-24 ENCOUNTER — Ambulatory Visit: Payer: BC Managed Care – PPO | Attending: Maternal & Fetal Medicine

## 2021-10-24 ENCOUNTER — Ambulatory Visit: Payer: BC Managed Care – PPO

## 2021-11-15 ENCOUNTER — Other Ambulatory Visit: Payer: Self-pay

## 2021-11-15 ENCOUNTER — Ambulatory Visit (INDEPENDENT_AMBULATORY_CARE_PROVIDER_SITE_OTHER): Payer: BC Managed Care – PPO

## 2021-11-15 VITALS — BP 97/62 | HR 95 | Wt 116.2 lb

## 2021-11-15 DIAGNOSIS — Z603 Acculturation difficulty: Secondary | ICD-10-CM | POA: Insufficient documentation

## 2021-11-15 DIAGNOSIS — Z758 Other problems related to medical facilities and other health care: Secondary | ICD-10-CM | POA: Insufficient documentation

## 2021-11-15 DIAGNOSIS — Z3A28 28 weeks gestation of pregnancy: Secondary | ICD-10-CM

## 2021-11-15 DIAGNOSIS — O99013 Anemia complicating pregnancy, third trimester: Secondary | ICD-10-CM

## 2021-11-15 DIAGNOSIS — O99713 Diseases of the skin and subcutaneous tissue complicating pregnancy, third trimester: Secondary | ICD-10-CM

## 2021-11-15 DIAGNOSIS — L299 Pruritus, unspecified: Secondary | ICD-10-CM

## 2021-11-15 DIAGNOSIS — Z3480 Encounter for supervision of other normal pregnancy, unspecified trimester: Secondary | ICD-10-CM

## 2021-11-15 DIAGNOSIS — Z789 Other specified health status: Secondary | ICD-10-CM

## 2021-11-15 MED ORDER — HYDROXYZINE PAMOATE 25 MG PO CAPS: 25.0000 mg | ORAL_CAPSULE | Freq: Three times a day (TID) | ORAL | 0 refills | Status: DC | PRN

## 2021-11-15 NOTE — Progress Notes (Signed)
Acids    LOW-RISK PREGNANCY OFFICE VISIT  Patient name: Melanie [name] MRN [id number]  Date of birth: 06-02-1996 Chief Complaint:   Routine Prenatal Visit  Subjective:   Melanie [name] is a 26 y.o. [id number] female at [redacted]w[redacted]d with an Estimated Date of Delivery: 02/06/22 being seen today for ongoing management of a low-risk pregnancy aeb has Encounter for supervision of normal pregnancy and GBS bacteriuria on their problem list.  Patient presents today with  leaking . She states she has had leaking, since arrival, when she walks or sneezes.  She reports she last urinated prior to rooming, but leaking occurred after.  Patient does endorse sexual activity last night. She also reports nightly itching that has been occurring for the past 2-3 weeks.  She states the itching is all over her body, but only occurs at night causing issues with sleep.   Patient endorses fetal movement. Patient denies abdominal cramping or contractions.  Patient denies other vaginal concerns including abnormal discharge and bleeding.  Contractions: Irritability. Vag. Bleeding: None.  Movement: Present.  Reviewed past medical,surgical, social, obstetrical and family history as well as problem list, medications and allergies.  Objective   Vitals:   11/15/21 0900  BP: 97/62  Pulse: 95  Weight: 116 lb 3.2 oz (52.7 kg)  Body mass index is 21.96 kg/m.  Total Weight Gain:11 lb 3.2 oz (5.08 kg)         Physical Examination:   General appearance: Well appearing, and in no distress  Mental status: Alert, oriented to person, place, and time  Skin: Warm & dry  Cardiovascular: Normal heart rate noted  Respiratory: Normal respiratory effort, no distress  Abdomen: Soft, gravid, nontender, AGA with Fundal Height: 27 cm  Pelvic: Vaginal vault with small amount whitish clear mucoid fluid. Removed with faux swab x 2. No apparent pooling.  Mucoid discharge from cervix which appears closed.  Nitrazine positive  x 2. Cervical exam performed  Dilation: Closed Effacement (%): Thick Station: Ballotable    Extremities: Edema: None  Fetal Status: Fetal Heart Rate (bpm): 156  Movement: Present   No results found for this or any previous visit (from the past 24 hour(s)).  Assessment & Plan:  Low-risk pregnancy of a 26 y.o., G3P2002 at [redacted]w[redacted]d with an Estimated Date of Delivery: 02/06/22   1. Supervision of other normal pregnancy, antepartum -Anticipatory guidance for upcoming appts. -Patient to schedule next appt in 4 weeks for an in-person visit. -Patient to be transferred to [facility] -Interpretations completed with assistance of video interpreter Samri.  2. [redacted] weeks gestation of pregnancy -Vaginal exam performed for report of LOF. -Reports sexual activity last night. -Nitrizine positive, but likely from cross-contamination with semen. -Will send for further evaluation at [facility].  Robyne Askew, NP notified of patient anticipated arrival upon completion of labs. -Patient instructed to report to [facility] after completion of labs.  3. Pruritus of pregnancy in third trimester -Discussed managing itching with hydroxyzine dosing at bedtime. -Will add-on bile acids to r/o ICP.      Meds: No orders of the defined types were placed in this encounter.  Labs/procedures today: Lab Orders         Glucose Tolerance, 2 Hours w/1 Hour         CBC         RPR         HIV Antibody (routine testing w rflx)         Bile acids, total  Reviewed: Preterm labor symptoms and general obstetric precautions including but not limited to vaginal bleeding, contractions, leaking of fluid and fetal movement were reviewed in detail with the patient.  All questions were answered.  Follow-up: Return in about 2 weeks (around 11/29/2021) for [place].  Orders Placed This Encounter  Procedures   Glucose Tolerance, 2 Hours w/1 Hour   CBC   RPR   HIV Antibody (routine testing w rflx)   Bile acids, total   Maryann Conners MSN,  CNM 11/15/2021

## 2021-11-16 LAB — CBC
Hematocrit: 28.6 % — ABNORMAL LOW (ref 34.0–46.6)
Hemoglobin: 9.2 g/dL — ABNORMAL LOW (ref 11.1–15.9)
MCH: 26 pg — ABNORMAL LOW (ref 26.6–33.0)
MCHC: 32.2 g/dL (ref 31.5–35.7)
MCV: 81 fL (ref 79–97)
Platelets: 273 10*3/uL (ref 150–450)
RBC: 3.54 x10E6/uL — ABNORMAL LOW (ref 3.77–5.28)
RDW: 12.9 % (ref 11.7–15.4)
WBC: 6.9 10*3/uL (ref 3.4–10.8)

## 2021-11-16 LAB — HIV ANTIBODY (ROUTINE TESTING W REFLEX): HIV Screen 4th Generation wRfx: NONREACTIVE

## 2021-11-16 LAB — GLUCOSE TOLERANCE, 2 HOURS W/ 1HR
Glucose, 1 hour: 124 mg/dL (ref 70–179)
Glucose, 2 hour: 108 mg/dL (ref 70–152)
Glucose, Fasting: 72 mg/dL (ref 70–91)

## 2021-11-16 LAB — RPR: RPR Ser Ql: NONREACTIVE

## 2021-11-17 LAB — BILE ACIDS, TOTAL: Bile Acids Total: 4 umol/L (ref 0.0–10.0)

## 2021-11-23 DIAGNOSIS — O99013 Anemia complicating pregnancy, third trimester: Secondary | ICD-10-CM | POA: Insufficient documentation

## 2021-11-23 DIAGNOSIS — Z862 Personal history of diseases of the blood and blood-forming organs and certain disorders involving the immune mechanism: Secondary | ICD-10-CM

## 2021-11-23 HISTORY — DX: Personal history of diseases of the blood and blood-forming organs and certain disorders involving the immune mechanism: Z86.2

## 2021-11-23 MED ORDER — FERROUS SULFATE 325 (65 FE) MG PO TBEC: 325.0000 mg | DELAYED_RELEASE_TABLET | ORAL | 1 refills | Status: DC

## 2021-11-23 NOTE — Addendum Note (Signed)
Addended by: Gerrit Heck L on: 11/23/2021 01:09 AM   Modules accepted: Orders

## 2021-11-25 ENCOUNTER — Other Ambulatory Visit: Payer: Self-pay

## 2021-11-25 ENCOUNTER — Ambulatory Visit: Payer: BC Managed Care – PPO | Attending: Maternal & Fetal Medicine

## 2021-11-25 ENCOUNTER — Ambulatory Visit: Payer: BC Managed Care – PPO | Admitting: *Deleted

## 2021-11-25 VITALS — BP 99/61 | HR 105

## 2021-11-25 DIAGNOSIS — Z3A29 29 weeks gestation of pregnancy: Secondary | ICD-10-CM | POA: Diagnosis not present

## 2021-11-25 DIAGNOSIS — Z362 Encounter for other antenatal screening follow-up: Secondary | ICD-10-CM | POA: Insufficient documentation

## 2021-11-25 DIAGNOSIS — Z3480 Encounter for supervision of other normal pregnancy, unspecified trimester: Secondary | ICD-10-CM

## 2021-11-25 NOTE — Progress Notes (Signed)
Pt c/o all over body itching for the past two months.

## 2021-11-28 ENCOUNTER — Encounter: Payer: BC Managed Care – PPO | Admitting: Obstetrics and Gynecology

## 2021-12-04 ENCOUNTER — Encounter (HOSPITAL_BASED_OUTPATIENT_CLINIC_OR_DEPARTMENT_OTHER): Payer: Self-pay | Admitting: Obstetrics & Gynecology

## 2021-12-04 ENCOUNTER — Other Ambulatory Visit: Payer: Self-pay

## 2021-12-04 ENCOUNTER — Ambulatory Visit (INDEPENDENT_AMBULATORY_CARE_PROVIDER_SITE_OTHER): Payer: BC Managed Care – PPO | Admitting: Obstetrics & Gynecology

## 2021-12-04 VITALS — BP 99/65 | HR 75 | Wt 127.0 lb

## 2021-12-04 DIAGNOSIS — O99013 Anemia complicating pregnancy, third trimester: Secondary | ICD-10-CM

## 2021-12-04 DIAGNOSIS — R8271 Bacteriuria: Secondary | ICD-10-CM

## 2021-12-04 DIAGNOSIS — Z789 Other specified health status: Secondary | ICD-10-CM

## 2021-12-04 DIAGNOSIS — Z3A30 30 weeks gestation of pregnancy: Secondary | ICD-10-CM

## 2021-12-04 DIAGNOSIS — Z3483 Encounter for supervision of other normal pregnancy, third trimester: Secondary | ICD-10-CM

## 2021-12-04 DIAGNOSIS — L299 Pruritus, unspecified: Secondary | ICD-10-CM

## 2021-12-04 DIAGNOSIS — O99713 Diseases of the skin and subcutaneous tissue complicating pregnancy, third trimester: Secondary | ICD-10-CM

## 2021-12-04 MED ORDER — FERROUS SULFATE 325 (65 FE) MG PO TABS: 325.0000 mg | ORAL_TABLET | ORAL | 3 refills | Status: DC

## 2021-12-06 NOTE — Progress Notes (Signed)
? ?  PRENATAL VISIT NOTE ? ?Subjective:  ?Melanie [name] is a 26 y.o. G3P2002 at [redacted]w[redacted]d being seen today for ongoing prenatal care.  She is currently monitored for the following issues for this low-risk pregnancy and has Encounter for supervision of normal pregnancy; GBS bacteriuria; Language barrier; and Anemia of mother in pregnancy, antepartum, third trimester on their problem list. ? ?Patient reports no complaints.  Contractions: Not present. Vag. Bleeding: None.  Movement: Present. Denies leaking of fluid.  ? ?The following portions of the patient's history were reviewed and updated as appropriate: allergies, current medications, past family history, past medical history, past social history, past surgical history and problem list.  ? ?Objective:  ? ?Vitals:  ? 12/04/21 1004  ?BP: 99/65  ?Pulse: 75  ?Weight: 127 lb (57.6 kg)  ? ? ?Fetal Status: Fetal Heart Rate (bpm): 155 Fundal Height: 29 cm Movement: Present    ? ?General:  Alert, oriented and cooperative. Patient is in no acute distress.  ?Skin: Skin is warm and dry. No rash noted.   ?Cardiovascular: Normal heart rate noted  ?Respiratory: Normal respiratory effort, no problems with respiration noted  ?Abdomen: Soft, gravid, appropriate for gestational age.  Pain/Pressure: Absent     ?Pelvic: Cervical exam deferred        ?Extremities: Normal range of motion.  Edema: None  ?Mental Status: Normal mood and affect. Normal behavior. Normal judgment and thought content.  ? ?Assessment and Plan:  ?Pregnancy: G3P2002 at [redacted]w[redacted]d ?1. [redacted] weeks gestation of pregnancy ?- on PNV ?- recheck 2 weeks ? ?2. Pruritus of pregnancy in third trimester ?- bile acids normal ? ?3. Language barrier ?- Anhu Mohamed, translator, present for entire visit. ? ?4. Anemia of mother in pregnancy, antepartum, third trimester ?- on oral iron ?- Horizon testing ordered ?- will repeat at 36 weeks.  May need iron infusion. ? ?5. GBS bacteriuria ?- will be treated in L&D ? ?Preterm labor  symptoms and general obstetric precautions including but not limited to vaginal bleeding, contractions, leaking of fluid and fetal movement were reviewed in detail with the patient. ?Please refer to After Visit Summary for other counseling recommendations.  ? ?Return in about 2 weeks (around 12/18/2021). ? ?Future Appointments  ?Date Time Provider [place]  ?12/19/2021  4:00 PM Megan Salon, MD [facility]  ? ? ?Megan Salon, MD ? ?

## 2021-12-12 ENCOUNTER — Encounter: Payer: BC Managed Care – PPO | Admitting: Obstetrics and Gynecology

## 2021-12-19 ENCOUNTER — Ambulatory Visit (INDEPENDENT_AMBULATORY_CARE_PROVIDER_SITE_OTHER): Payer: BC Managed Care – PPO | Admitting: Obstetrics & Gynecology

## 2021-12-19 ENCOUNTER — Encounter (HOSPITAL_BASED_OUTPATIENT_CLINIC_OR_DEPARTMENT_OTHER): Payer: Self-pay | Admitting: Obstetrics & Gynecology

## 2021-12-19 ENCOUNTER — Other Ambulatory Visit: Payer: Self-pay

## 2021-12-19 VITALS — BP 94/55 | HR 66 | Wt 126.2 lb

## 2021-12-19 DIAGNOSIS — R8271 Bacteriuria: Secondary | ICD-10-CM

## 2021-12-19 DIAGNOSIS — Z789 Other specified health status: Secondary | ICD-10-CM

## 2021-12-19 DIAGNOSIS — Z3483 Encounter for supervision of other normal pregnancy, third trimester: Secondary | ICD-10-CM

## 2021-12-19 DIAGNOSIS — O99013 Anemia complicating pregnancy, third trimester: Secondary | ICD-10-CM

## 2021-12-19 DIAGNOSIS — Z3A33 33 weeks gestation of pregnancy: Secondary | ICD-10-CM

## 2021-12-19 DIAGNOSIS — L299 Pruritus, unspecified: Secondary | ICD-10-CM

## 2021-12-19 MED ORDER — HYDROCORTISONE 2.5 % EX CREA: TOPICAL_CREAM | Freq: Two times a day (BID) | CUTANEOUS | 2 refills | Status: DC

## 2021-12-22 NOTE — Progress Notes (Signed)
? ?  PRENATAL VISIT NOTE ? ?Subjective:  ?Melanie [name] is a 26 y.o. G3P2002 at [redacted]w[redacted]d being seen today for ongoing prenatal care.  She is currently monitored for the following issues for this low-risk pregnancy and has Encounter for supervision of normal pregnancy; GBS bacteriuria; Language barrier; and Anemia of mother in pregnancy, antepartum, third trimester on their problem list. ? ?Patient reports no complaints.  Contractions: Not present.   Movement: Present. Denies leaking of fluid.  ? ?The following portions of the patient's history were reviewed and updated as appropriate: allergies, current medications, past family history, past medical history, past social history, past surgical history and problem list.  ? ?Objective:  ? ?Vitals:  ? 12/19/21 1627  ?BP: (!) 94/55  ?Pulse: 66  ?Weight: 126 lb 3.2 oz (57.2 kg)  ? ? ?Fetal Status: Fetal Heart Rate (bpm): 158 Fundal Height: 31 cm Movement: Present    ? ?General:  Alert, oriented and cooperative. Patient is in no acute distress.  ?Skin: Skin is warm and dry. No rash noted.   ?Cardiovascular: Normal heart rate noted  ?Respiratory: Normal respiratory effort, no problems with respiration noted  ?Abdomen: Soft, gravid, appropriate for gestational age.  Pain/Pressure: Absent     ?Pelvic: Cervical exam deferred        ?Extremities: Normal range of motion.  Edema: None  ?Mental Status: Normal mood and affect. Normal behavior. Normal judgment and thought content.  ? ?Assessment and Plan:  ?Pregnancy: G3P2002 at [redacted]w[redacted]d ?1. [redacted] weeks gestation of pregnancy ?- on PNV ?- recheck 2 weeks ? ?2. Encounter for supervision of other normal pregnancy in third trimester ? ?3. Itching ?- had bile acids done 11/15/2021.  Recommended repeating today. Pt adamantly declines blood work today.  Importance discussed with translator.  Will try topical hydrocortisone.  If no improvement, will repeat bile acids. ?- rx for hydrocortisone cream 2.5% topically BID to pharmacy ? ?4.  Non-English speaking patient ?-Translator  Bedor Elnegomi assisted throughout visit with communication ? ?5. Anemia of mother in pregnancy, antepartum, third trimester ?- on oral iron ? ?6. GBS bacteriuria ?- will treat in L&D ? ?Preterm labor symptoms and general obstetric precautions including but not limited to vaginal bleeding, contractions, leaking of fluid and fetal movement were reviewed in detail with the patient. ?Please refer to After Visit Summary for other counseling recommendations.  ? ?Return in about 1 week (around 12/26/2021). ? ?Future Appointments  ?Date Time Provider Department Center  ?01/02/2022 10:45 AM Jerene Bears, MD [facility]  ?01/09/2022  1:30 PM Jerene Bears, MD [facility]  ?01/15/2022 10:30 AM Marny Lowenstein, PA-C [facility]  ?01/23/2022 11:00 AM Jerene Bears, MD [facility]  ?01/30/2022  1:30 PM Jerene Bears, MD [facility]  ?02/06/2022 11:15 AM Jerene Bears, MD [facility]  ? ? ?Jerene Bears, MD  ?

## 2021-12-25 ENCOUNTER — Encounter: Payer: BC Managed Care – PPO | Admitting: Obstetrics and Gynecology

## 2022-01-02 ENCOUNTER — Ambulatory Visit (INDEPENDENT_AMBULATORY_CARE_PROVIDER_SITE_OTHER): Payer: BC Managed Care – PPO | Admitting: Obstetrics & Gynecology

## 2022-01-02 VITALS — BP 105/70 | HR 76 | Wt 129.0 lb

## 2022-01-02 DIAGNOSIS — R8271 Bacteriuria: Secondary | ICD-10-CM

## 2022-01-02 DIAGNOSIS — O99013 Anemia complicating pregnancy, third trimester: Secondary | ICD-10-CM

## 2022-01-02 DIAGNOSIS — Z789 Other specified health status: Secondary | ICD-10-CM

## 2022-01-02 DIAGNOSIS — Z3483 Encounter for supervision of other normal pregnancy, third trimester: Secondary | ICD-10-CM

## 2022-01-02 DIAGNOSIS — R300 Dysuria: Secondary | ICD-10-CM

## 2022-01-02 DIAGNOSIS — Z3A35 35 weeks gestation of pregnancy: Secondary | ICD-10-CM

## 2022-01-02 LAB — POCT URINALYSIS DIPSTICK
Bilirubin, UA: NEGATIVE
Glucose, UA: NEGATIVE
Ketones, UA: NEGATIVE
Nitrite, UA: NEGATIVE
Protein, UA: NEGATIVE
Spec Grav, UA: 1.015 (ref 1.010–1.025)
Urobilinogen, UA: 0.2 E.U./dL
pH, UA: 5.5 (ref 5.0–8.0)

## 2022-01-02 LAB — OB RESULTS CONSOLE GBS: GBS: POSITIVE

## 2022-01-02 MED ORDER — PRENATAL COMPLETE 14-0.4 MG PO TABS: 1.0000 | ORAL_TABLET | Freq: Every day | ORAL | 12 refills | Status: DC

## 2022-01-02 MED ORDER — FERROUS SULFATE 325 (65 FE) MG PO TABS: 325.0000 mg | ORAL_TABLET | ORAL | 3 refills | Status: DC

## 2022-01-02 MED ORDER — CEPHALEXIN 250 MG PO CAPS: 250.0000 mg | ORAL_CAPSULE | Freq: Four times a day (QID) | ORAL | 0 refills | Status: DC

## 2022-01-02 NOTE — Progress Notes (Signed)
? ?  PRENATAL VISIT NOTE ? ?Subjective:  ?Melanie [name] is a 26 y.o. G3P2002 at [redacted]w[redacted]d being seen today for ongoing prenatal care.  She is currently monitored for the following issues for this low-risk pregnancy and has Encounter for supervision of normal pregnancy; GBS bacteriuria; Language barrier; and Anemia of mother in pregnancy, antepartum, third trimester on their problem list. ? ?Patient reports complaint of dysuria.  No hematuria.  No back pain.  No fever.  Contractions: Not present. Vag. Bleeding: None.  Movement: Present. Denies leaking of fluid.  ? ?Pt is not taking PNV or iron.  Discussed with her importance of this.  Prescriptions sent to pharmacy.  She is aware these may not be covered and will need to get OTC.  Is Ramadan and pt is going fasting during the day light hours including no water.  Advised pt to be careful for risks of dehydration.   ? ?Needs tdap.  Pt declined today because this will cause her ot break her fast so order given for her to do at pharmacy in PM. ? ?The following portions of the patient's history were reviewed and updated as appropriate: allergies, current medications, past family history, past medical history, past social history, past surgical history and problem list.  ? ?Objective:  ? ?Vitals:  ? 01/02/22 1054  ?BP: 105/70  ?Pulse: 76  ?Weight: 129 lb (58.5 kg)  ? ? ?Fetal Status: Fetal Heart Rate (bpm): 145 Fundal Height: 33 cm Movement: Present    ? ?General:  Alert, oriented and cooperative. Patient is in no acute distress.  ?Skin: Skin is warm and dry. No rash noted.   ?Cardiovascular: Normal heart rate noted  ?Respiratory: Normal respiratory effort, no problems with respiration noted  ?Abdomen: Soft, gravid, appropriate for gestational age.  Pain/Pressure: Absent     ?Pelvic: Cervical exam deferred        ?Extremities: Normal range of motion.  Edema: None  ?Mental Status: Normal mood and affect. Normal behavior. Normal judgment and thought content.   ? ?Assessment and Plan:  ?Pregnancy: [id number] at [redacted]w[redacted]d ?1. [redacted] weeks gestation of pregnancy ?- highly encouraged pt to take PNV and iron ?- consider rechecking hb at next visit ?- recheck 1-2 weeks ? ?2. Encounter for supervision of other normal pregnancy in third trimester ? ?3. Anemia of mother in pregnancy, antepartum, third trimester ? ?4. Language barrier ?- [name], translator, present for all of visit with pt ? ?5. GBS bacteriuria ?- will treat in labor ? ?6. Dysuria ?- rx sent to pharmacy as communication with pt when not in office has been very difficult.  If negative, will advise to stop treatment ?- Urine Culture ?- POCT Urinalysis Dipstick ? ?Preterm labor symptoms and general obstetric precautions including but not limited to vaginal bleeding, contractions, leaking of fluid and fetal movement were reviewed in detail with the patient. ?Please refer to After Visit Summary for other counseling recommendations.  ? ?Return in about 1 week (around 01/09/2022) for GC/Chl/GBS. ? ?Future Appointments  ?Date Time Provider Department Center  ?01/09/2022  1:30 PM Jerene Bears, MD [facility]  ?01/15/2022 10:30 AM Marny Lowenstein, PA-C [facility]  ?01/23/2022 11:00 AM Jerene Bears, MD [facility]  ?01/30/2022  1:30 PM Jerene Bears, MD [facility]  ?02/06/2022 11:15 AM Jerene Bears, MD [facility]  ? ? ?Jerene Bears, MD  ?

## 2022-01-05 LAB — URINE CULTURE

## 2022-01-08 ENCOUNTER — Other Ambulatory Visit (HOSPITAL_BASED_OUTPATIENT_CLINIC_OR_DEPARTMENT_OTHER): Payer: Self-pay | Admitting: Obstetrics & Gynecology

## 2022-01-09 ENCOUNTER — Encounter (HOSPITAL_BASED_OUTPATIENT_CLINIC_OR_DEPARTMENT_OTHER): Payer: BC Managed Care – PPO | Admitting: Obstetrics & Gynecology

## 2022-01-09 ENCOUNTER — Encounter: Payer: BC Managed Care – PPO | Admitting: Obstetrics and Gynecology

## 2022-01-09 ENCOUNTER — Telehealth (HOSPITAL_BASED_OUTPATIENT_CLINIC_OR_DEPARTMENT_OTHER): Payer: Self-pay | Admitting: Obstetrics & Gynecology

## 2022-01-09 NOTE — Telephone Encounter (Signed)
Called and spoke with the patient husband he said they not in [place] and will come to next appointment. ?

## 2022-01-15 ENCOUNTER — Other Ambulatory Visit (HOSPITAL_COMMUNITY)
Admission: RE | Admit: 2022-01-15 | Discharge: 2022-01-15 | Disposition: A | Discharge status: Home / Self Care | Payer: BC Managed Care – PPO | Source: Ambulatory Visit | Attending: Medical | Admitting: Medical

## 2022-01-15 ENCOUNTER — Encounter: Payer: BC Managed Care – PPO | Admitting: Certified Nurse Midwife

## 2022-01-15 ENCOUNTER — Ambulatory Visit (INDEPENDENT_AMBULATORY_CARE_PROVIDER_SITE_OTHER): Payer: BC Managed Care – PPO | Admitting: Medical

## 2022-01-15 ENCOUNTER — Encounter (HOSPITAL_BASED_OUTPATIENT_CLINIC_OR_DEPARTMENT_OTHER): Payer: Self-pay | Admitting: Medical

## 2022-01-15 VITALS — BP 102/67 | HR 93 | Wt 129.8 lb

## 2022-01-15 DIAGNOSIS — R8271 Bacteriuria: Secondary | ICD-10-CM

## 2022-01-15 DIAGNOSIS — Z3483 Encounter for supervision of other normal pregnancy, third trimester: Secondary | ICD-10-CM | POA: Diagnosis present

## 2022-01-15 DIAGNOSIS — O99013 Anemia complicating pregnancy, third trimester: Secondary | ICD-10-CM

## 2022-01-15 DIAGNOSIS — Z789 Other specified health status: Secondary | ICD-10-CM

## 2022-01-15 DIAGNOSIS — Z3A36 36 weeks gestation of pregnancy: Secondary | ICD-10-CM

## 2022-01-15 LAB — CBC
Hematocrit: 29.9 % — ABNORMAL LOW (ref 34.0–46.6)
Hemoglobin: 9.3 g/dL — ABNORMAL LOW (ref 11.1–15.9)
MCH: 24.4 pg — ABNORMAL LOW (ref 26.6–33.0)
MCHC: 31.1 g/dL — ABNORMAL LOW (ref 31.5–35.7)
MCV: 79 fL (ref 79–97)
Platelets: 232 10*3/uL (ref 150–450)
RBC: 3.81 x10E6/uL (ref 3.77–5.28)
RDW: 16.1 % — ABNORMAL HIGH (ref 11.7–15.4)
WBC: 7.5 10*3/uL (ref 3.4–10.8)

## 2022-01-15 NOTE — Progress Notes (Signed)
? ?  PRENATAL VISIT NOTE ? ?Subjective:  ?Melanie [name] is a 26 y.o. G3P2002 at [redacted]w[redacted]d being seen today for ongoing prenatal care.  She is currently monitored for the following issues for this low-risk pregnancy and has Encounter for supervision of normal pregnancy; GBS bacteriuria; Language barrier; and Anemia of mother in pregnancy, antepartum, third trimester on their problem list. ? ?Patient reports occasional contractions.  Contractions: Irregular. Vag. Bleeding: None.  Movement: Present. Denies leaking of fluid.  ? ?The following portions of the patient's history were reviewed and updated as appropriate: allergies, current medications, past family history, past medical history, past social history, past surgical history and problem list.  ? ?Objective:  ? ?Vitals:  ? 01/15/22 1116  ?BP: 102/67  ?Pulse: 93  ?Weight: 129 lb 12.8 oz (58.9 kg)  ? ? ?Fetal Status: Fetal Heart Rate (bpm): 136 Fundal Height: 35 cm Movement: Present  Presentation: Vertex ? ?General:  Alert, oriented and cooperative. Patient is in no acute distress.  ?Skin: Skin is warm and dry. No rash noted.   ?Cardiovascular: Normal heart rate noted  ?Respiratory: Normal respiratory effort, no problems with respiration noted  ?Abdomen: Soft, gravid, appropriate for gestational age.  Pain/Pressure: Present     ?Pelvic: Cervical exam performed in the presence of a chaperone Dilation: Closed Effacement (%): 30 Station: -3  ?Extremities: Normal range of motion.  Edema: None  ?Mental Status: Normal mood and affect. Normal behavior. Normal judgment and thought content.  ? ?Assessment and Plan:  ?Pregnancy: [id number] at [redacted]w[redacted]d ?1. Encounter for supervision of other normal pregnancy in third trimester ?- Cervicovaginal ancillary only( [place]) ? ?2. Anemia of mother in pregnancy, antepartum, third trimester ?- CBC ? ?3. Language barrier ?- Interpreter present ? ?4. GBS bacteriuria ?- Treat in labor, patient aware ? ?5. [redacted] weeks gestation of  pregnancy ? ?Preterm labor symptoms and general obstetric precautions including but not limited to vaginal bleeding, contractions, leaking of fluid and fetal movement were reviewed in detail with the patient. ?Please refer to After Visit Summary for other counseling recommendations.  ? ?Return in about 1 week (around 01/22/2022) for LOB, In-Person, any provider. ? ?Future Appointments  ?Date Time Provider Department Center  ?01/23/2022 11:00 AM Jerene Bears, MD [facility]  ?01/30/2022  1:30 PM Jerene Bears, MD [facility]  ?02/06/2022 11:15 AM Jerene Bears, MD [facility]  ? ? ?Vonzella Nipple, PA-C ? ?

## 2022-01-16 LAB — CERVICOVAGINAL ANCILLARY ONLY
Chlamydia: NEGATIVE
Comment: NEGATIVE
Comment: NORMAL
Neisseria Gonorrhea: NEGATIVE

## 2022-01-20 ENCOUNTER — Telehealth (HOSPITAL_BASED_OUTPATIENT_CLINIC_OR_DEPARTMENT_OTHER): Payer: Self-pay | Admitting: Obstetrics & Gynecology

## 2022-01-20 NOTE — Telephone Encounter (Signed)
No encounter needed

## 2022-01-22 ENCOUNTER — Encounter: Payer: BC Managed Care – PPO | Admitting: Obstetrics and Gynecology

## 2022-01-23 ENCOUNTER — Ambulatory Visit (INDEPENDENT_AMBULATORY_CARE_PROVIDER_SITE_OTHER): Payer: BC Managed Care – PPO | Admitting: Obstetrics & Gynecology

## 2022-01-23 VITALS — BP 96/64 | HR 74 | Wt 127.8 lb

## 2022-01-23 DIAGNOSIS — Z3483 Encounter for supervision of other normal pregnancy, third trimester: Secondary | ICD-10-CM

## 2022-01-23 DIAGNOSIS — Z789 Other specified health status: Secondary | ICD-10-CM

## 2022-01-23 DIAGNOSIS — Z3A38 38 weeks gestation of pregnancy: Secondary | ICD-10-CM

## 2022-01-23 DIAGNOSIS — O99013 Anemia complicating pregnancy, third trimester: Secondary | ICD-10-CM

## 2022-01-23 DIAGNOSIS — R8271 Bacteriuria: Secondary | ICD-10-CM

## 2022-01-23 DIAGNOSIS — Z758 Other problems related to medical facilities and other health care: Secondary | ICD-10-CM

## 2022-01-23 NOTE — Progress Notes (Signed)
? ?  PRENATAL VISIT NOTE ? ?Subjective:  ?Melanie [name] is a 26 y.o. G3P2002 at [redacted]w[redacted]d being seen today for ongoing prenatal care.  She is currently monitored for the following issues for this low-risk pregnancy and has Encounter for supervision of normal pregnancy; GBS bacteriuria; Language barrier; and Anemia of mother in pregnancy, antepartum, third trimester on their problem list. ? ?Patient reports no complaints.  Contractions: Not present. Vag. Bleeding: None.  Movement: Present. Denies leaking of fluid.  ? ?The following portions of the patient's history were reviewed and updated as appropriate: allergies, current medications, past family history, past medical history, past social history, past surgical history and problem list.  ? ?Objective:  ? ?Vitals:  ? 01/23/22 1129  ?BP: 96/64  ?Pulse: 74  ?Weight: 127 lb 12.8 oz (58 kg)  ? ? ?Fetal Status: Fetal Heart Rate (bpm): 145 Fundal Height: 35 cm Movement: Present  Presentation: Vertex ? ?General:  Alert, oriented and cooperative. Patient is in no acute distress.  ?Skin: Skin is warm and dry. No rash noted.   ?Cardiovascular: Normal heart rate noted  ?Respiratory: Normal respiratory effort, no problems with respiration noted  ?Abdomen: Soft, gravid, appropriate for gestational age.  Pain/Pressure: Present     ?Pelvic: Cervical exam performed in the presence of a chaperone Dilation: Closed Effacement (%): 50 Station: -3  ?Extremities: Normal range of motion.  Edema: None  ?Mental Status: Normal mood and affect. Normal behavior. Normal judgment and thought content.  ? ?Assessment and Plan:  ?Pregnancy: G3P2002 at [redacted]w[redacted]d ?1. [redacted] weeks gestation of pregnancy ?- on PNV ?- recheck 1 week ? ?2. GBS bacteriuria ?- will be treated in labor ? ?3. Language barrier ?Elisabeth Cara translator present throughout ? ?4.  Anemia in pregnancy, third trimester ?- on daily iron therapy ? ?Term labor symptoms and general obstetric precautions including but not  limited to vaginal bleeding, contractions, leaking of fluid and fetal movement were reviewed in detail with the patient. ?Please refer to After Visit Summary for other counseling recommendations.  ? ?Return in about 1 week (around 01/30/2022). ? ?Future Appointments  ?Date Time Provider Department Center  ?01/30/2022  1:30 PM Jerene Bears, MD [facility]  ?02/06/2022 11:15 AM Jerene Bears, MD [facility]  ? ? ?Jerene Bears, MD ? ?

## 2022-01-24 ENCOUNTER — Inpatient Hospital Stay (HOSPITAL_COMMUNITY)
Admission: AD | Admit: 2022-01-24 | Discharge: 2022-01-26 | DRG: 807 | Disposition: A | Discharge status: Home / Self Care | Payer: BC Managed Care – PPO | Attending: Obstetrics & Gynecology | Admitting: Obstetrics & Gynecology

## 2022-01-24 ENCOUNTER — Encounter (HOSPITAL_COMMUNITY): Payer: Self-pay | Admitting: Obstetrics & Gynecology

## 2022-01-24 DIAGNOSIS — O9902 Anemia complicating childbirth: Secondary | ICD-10-CM | POA: Diagnosis present

## 2022-01-24 DIAGNOSIS — R8271 Bacteriuria: Secondary | ICD-10-CM | POA: Diagnosis present

## 2022-01-24 DIAGNOSIS — Z789 Other specified health status: Secondary | ICD-10-CM

## 2022-01-24 DIAGNOSIS — O479 False labor, unspecified: Secondary | ICD-10-CM | POA: Diagnosis present

## 2022-01-24 DIAGNOSIS — O99824 Streptococcus B carrier state complicating childbirth: Secondary | ICD-10-CM | POA: Diagnosis present

## 2022-01-24 DIAGNOSIS — Z758 Other problems related to medical facilities and other health care: Secondary | ICD-10-CM | POA: Diagnosis present

## 2022-01-24 DIAGNOSIS — Z3A38 38 weeks gestation of pregnancy: Secondary | ICD-10-CM

## 2022-01-24 DIAGNOSIS — Z8619 Personal history of other infectious and parasitic diseases: Secondary | ICD-10-CM | POA: Diagnosis present

## 2022-01-24 DIAGNOSIS — O99013 Anemia complicating pregnancy, third trimester: Secondary | ICD-10-CM | POA: Diagnosis present

## 2022-01-24 DIAGNOSIS — D509 Iron deficiency anemia, unspecified: Secondary | ICD-10-CM | POA: Diagnosis present

## 2022-01-24 DIAGNOSIS — O26893 Other specified pregnancy related conditions, third trimester: Secondary | ICD-10-CM | POA: Diagnosis present

## 2022-01-24 DIAGNOSIS — Z862 Personal history of diseases of the blood and blood-forming organs and certain disorders involving the immune mechanism: Secondary | ICD-10-CM | POA: Diagnosis present

## 2022-01-24 HISTORY — DX: Other specified health status: Z78.9

## 2022-01-24 LAB — TYPE AND SCREEN
ABO/RH(D): A POS
Antibody Screen: NEGATIVE

## 2022-01-24 LAB — CBC
HCT: 30.6 % — ABNORMAL LOW (ref 36.0–46.0)
Hemoglobin: 9.4 g/dL — ABNORMAL LOW (ref 12.0–15.0)
MCH: 25.5 pg — ABNORMAL LOW (ref 26.0–34.0)
MCHC: 30.7 g/dL (ref 30.0–36.0)
MCV: 82.9 fL (ref 80.0–100.0)
Platelets: 202 10*3/uL (ref 150–400)
RBC: 3.69 MIL/uL — ABNORMAL LOW (ref 3.87–5.11)
RDW: 18.1 % — ABNORMAL HIGH (ref 11.5–15.5)
WBC: 6.8 10*3/uL (ref 4.0–10.5)
nRBC: 0 % (ref 0.0–0.2)

## 2022-01-24 MED ORDER — OXYTOCIN BOLUS FROM INFUSION
333.0000 mL | Freq: Once | INTRAVENOUS | Status: AC
  Administered 2022-01-24: 333 mL via INTRAVENOUS

## 2022-01-24 MED ORDER — SODIUM CHLORIDE 0.9 % IV SOLN: 250.0000 mL | INTRAVENOUS | Status: DC | PRN

## 2022-01-24 MED ORDER — ONDANSETRON HCL 4 MG/2ML IJ SOLN: 4.0000 mg | Freq: Four times a day (QID) | INTRAMUSCULAR | Status: DC | PRN

## 2022-01-24 MED ORDER — WITCH HAZEL-GLYCERIN EX PADS: 1.0000 "application " | MEDICATED_PAD | CUTANEOUS | Status: DC | PRN

## 2022-01-24 MED ORDER — ZOLPIDEM TARTRATE 5 MG PO TABS
5.0000 mg | ORAL_TABLET | Freq: Every evening | ORAL | Status: DC | PRN
Start: 2022-01-24 — End: 2022-01-26

## 2022-01-24 MED ORDER — SODIUM CHLORIDE 0.9 % IV SOLN: 1.0000 g | INTRAVENOUS | Status: DC

## 2022-01-24 MED ORDER — COCONUT OIL OIL: 1.0000 "application " | TOPICAL_OIL | Status: DC | PRN

## 2022-01-24 MED ORDER — MEASLES, MUMPS & RUBELLA VAC IJ SOLR: 0.5000 mL | Freq: Once | INTRAMUSCULAR | Status: DC

## 2022-01-24 MED ORDER — BENZOCAINE-MENTHOL 20-0.5 % EX AERO
1.0000 | INHALATION_SPRAY | CUTANEOUS | Status: DC | PRN
Start: 2022-01-24 — End: 2022-01-26

## 2022-01-24 MED ORDER — ACETAMINOPHEN 325 MG PO TABS
650.0000 mg | ORAL_TABLET | ORAL | Status: DC | PRN
  Administered 2022-01-25: 650 mg via ORAL
  Filled 2022-01-24: qty 2

## 2022-01-24 MED ORDER — LACTATED RINGERS IV SOLN: INTRAVENOUS | Status: DC

## 2022-01-24 MED ORDER — SODIUM CHLORIDE 0.9% FLUSH
3.0000 mL | Freq: Two times a day (BID) | INTRAVENOUS | Status: DC
  Administered 2022-01-25 (×2): 3 mL via INTRAVENOUS

## 2022-01-24 MED ORDER — OXYCODONE-ACETAMINOPHEN 5-325 MG PO TABS: 1.0000 | ORAL_TABLET | ORAL | Status: DC | PRN

## 2022-01-24 MED ORDER — TETANUS-DIPHTH-ACELL PERTUSSIS 5-2.5-18.5 LF-MCG/0.5 IM SUSY: 0.5000 mL | PREFILLED_SYRINGE | Freq: Once | INTRAMUSCULAR | Status: DC

## 2022-01-24 MED ORDER — SENNOSIDES-DOCUSATE SODIUM 8.6-50 MG PO TABS
2.0000 | ORAL_TABLET | ORAL | Status: DC
  Administered 2022-01-24 – 2022-01-25 (×2): 2 via ORAL
  Filled 2022-01-24 (×2): qty 2

## 2022-01-24 MED ORDER — ONDANSETRON HCL 4 MG PO TABS: 4.0000 mg | ORAL_TABLET | ORAL | Status: DC | PRN

## 2022-01-24 MED ORDER — SIMETHICONE 80 MG PO CHEW: 80.0000 mg | CHEWABLE_TABLET | ORAL | Status: DC | PRN

## 2022-01-24 MED ORDER — SOD CITRATE-CITRIC ACID 500-334 MG/5ML PO SOLN: 30.0000 mL | ORAL | Status: DC | PRN

## 2022-01-24 MED ORDER — DIPHENHYDRAMINE HCL 25 MG PO CAPS: 25.0000 mg | ORAL_CAPSULE | Freq: Four times a day (QID) | ORAL | Status: DC | PRN

## 2022-01-24 MED ORDER — PRENATAL MULTIVITAMIN CH
1.0000 | ORAL_TABLET | Freq: Every day | ORAL | Status: DC
  Administered 2022-01-24 – 2022-01-25 (×2): 1 via ORAL
  Filled 2022-01-24 (×2): qty 1

## 2022-01-24 MED ORDER — OXYCODONE-ACETAMINOPHEN 5-325 MG PO TABS: 2.0000 | ORAL_TABLET | ORAL | Status: DC | PRN

## 2022-01-24 MED ORDER — LIDOCAINE HCL (PF) 1 % IJ SOLN: 30.0000 mL | INTRAMUSCULAR | Status: DC | PRN

## 2022-01-24 MED ORDER — ACETAMINOPHEN 325 MG PO TABS: 650.0000 mg | ORAL_TABLET | ORAL | Status: DC | PRN

## 2022-01-24 MED ORDER — AMPICILLIN SODIUM 2 G IJ SOLR
2.0000 g | Freq: Once | INTRAMUSCULAR | Status: AC
  Administered 2022-01-24: 2 g via INTRAVENOUS
  Filled 2022-01-24: qty 2000

## 2022-01-24 MED ORDER — SODIUM CHLORIDE 0.9% FLUSH: 3.0000 mL | INTRAVENOUS | Status: DC | PRN

## 2022-01-24 MED ORDER — OXYTOCIN-SODIUM CHLORIDE 30-0.9 UT/500ML-% IV SOLN
2.5000 [IU]/h | INTRAVENOUS | Status: DC
Start: 2022-01-24 — End: 2022-01-24
  Administered 2022-01-24: 2.5 [IU]/h via INTRAVENOUS
  Filled 2022-01-24: qty 500

## 2022-01-24 MED ORDER — ONDANSETRON HCL 4 MG/2ML IJ SOLN: 4.0000 mg | INTRAMUSCULAR | Status: DC | PRN

## 2022-01-24 MED ORDER — DIBUCAINE (PERIANAL) 1 % EX OINT: 1.0000 "application " | TOPICAL_OINTMENT | CUTANEOUS | Status: DC | PRN

## 2022-01-24 MED ORDER — LACTATED RINGERS IV SOLN: 500.0000 mL | INTRAVENOUS | Status: DC | PRN

## 2022-01-24 MED ORDER — IBUPROFEN 600 MG PO TABS
600.0000 mg | ORAL_TABLET | Freq: Four times a day (QID) | ORAL | Status: DC
  Administered 2022-01-24 – 2022-01-26 (×7): 600 mg via ORAL
  Filled 2022-01-24 (×8): qty 1

## 2022-01-24 NOTE — H&P (Signed)
OBSTETRIC ADMISSION HISTORY AND PHYSICAL ? ?Melanie [name] is a 26 y.o. female G3P2002 with IUP at [redacted]w[redacted]d by LMP presenting for SOL. She reports +FMs, No LOF, no VB, no blurry vision, headaches or peripheral edema, and RUQ pain.  She plans on breast and formula feeding. She request nexplanon for birth control. ?She received her prenatal care at  [facility] initially and then transferred to [facility]   ? ?Dating: By LMP --->  Estimated Date of Delivery: 02/06/22 ? ?Prenatal History/Complications:  ?--language barrier (arabic)  ?--GBS positive  ?--Iron deficiency anemia  ? ? ?Past Medical History: ?Past Medical History:  ?Diagnosis Date  ? Medical history non-contributory   ? ? ?Past Surgical History: ?Past Surgical History:  ?Procedure Laterality Date  ? NO PAST SURGERIES    ? ? ?Obstetrical History: ?OB History   ? ? Gravida  ?3  ? Para  ?2  ? Term  ?2  ? Preterm  ?   ? AB  ?   ? Living  ?2  ?  ? ? SAB  ?   ? IAB  ?   ? Ectopic  ?   ? Multiple  ?   ? Live Births  ?2  ?   ?  ?  ? ? ?Social History ?Social History  ? ?Socioeconomic History  ? Marital status: Married  ?  Spouse name: Adel  ? Number of children: 2  ? Years of education: Not on file  ? Highest education level: 4th grade  ?Occupational History  ? Not on file  ?Tobacco Use  ? Smoking status: Never  ? Smokeless tobacco: Never  ?Vaping Use  ? Vaping Use: Never used  ?Substance and Sexual Activity  ? Alcohol use: Never  ? Drug use: Never  ? Sexual activity: Yes  ?Other Topics Concern  ? Not on file  ?Social History Narrative  ? Not on file  ? ?Social Determinants of Health  ? ?Financial Resource Strain: Not on file  ?Food Insecurity: Not on file  ?Transportation Needs: Not on file  ?Physical Activity: Not on file  ?Stress: Not on file  ?Social Connections: Not on file  ? ? ?Family History: ?Family History  ?Problem Relation Age of Onset  ? Liver disease Mother   ? Hypertension Father   ? ? ?Allergies: ?No Known Allergies ? ?Medications Prior to Admission   ?Medication Sig Dispense Refill Last Dose  ? ferrous sulfate 325 (65 FE) MG tablet Take 1 tablet (325 mg total) by mouth every other day. 30 tablet 3 01/23/2022  ? Prenatal Vit-Fe Fumarate-FA (PRENATAL COMPLETE) 14-0.4 MG TABS Take 1 tablet by mouth daily. 30 tablet 12 Past Week  ? hydrocortisone 2.5 % cream Apply topically 2 (two) times daily. (Patient not taking: Reported on 01/15/2022) 30 g 2   ? hydrOXYzine (VISTARIL) 25 MG capsule Take 1 capsule (25 mg total) by mouth 3 (three) times daily as needed for itching. (Patient not taking: Reported on 12/04/2021) 30 capsule 0   ? loratadine (CLARITIN) 10 MG tablet Take 1 tablet (10 mg total) by mouth daily. 30 tablet 1   ? ? ? ?Review of Systems  ? ?All systems reviewed and negative except as stated in HPI ? ?Blood pressure (!) 155/87, pulse (!) 58, temperature 98.3 ?F (36.8 ?C), temperature source Oral, resp. rate 16, last menstrual period 05/02/2021, SpO2 100 %. ?General appearance: alert, cooperative, and moderate distress with contractions  ?Lungs: Normal WOB ?Heart: regular rate  ?Abdomen: soft, non-tender ?  Pelvic: NEFG ?Extremities: Homans sign is negative, no sign of DVT ?Presentation: cephalic ?Fetal monitoringBaseline: 135 bpm, Variability: Good {> 6 bpm), Accelerations: Reactive, and Decelerations: Absent ?Uterine activity every 2-3 min  ?Dilation: 8 ?Effacement (%): 100 ?Station: -1 ?Exam by:: Wayna Chalet, RN ? ? ?Prenatal labs: ?ABO, Rh: --/--/A POS (04/21 0700) ?Antibody: NEG (04/21 0700) ?Rubella: 3.65 (10/17 2671) ?RPR: Non Reactive (02/10 0851)  ?HBsAg: Negative (10/17 0943)  ?HIV: Non Reactive (02/10 0851)  ?GBS: Positive/-- (03/30 0000)  ?2 hr Glucola passed ?Genetic screening declined  ?Anatomy US normal  ? ?Prenatal Transfer Tool  ?Maternal Diabetes: No ?Genetic Screening: Declined ?Maternal Ultrasounds/Referrals: Normal ?Fetal Ultrasounds or other Referrals:  None ?Maternal Substance Abuse:  No ?Significant Maternal Medications:  None ?Significant  Maternal Lab Results: Group B Strep positive ? ?Results for orders placed or performed during the hospital encounter of 01/24/22 (from the past 24 hour(s))  ?CBC  ? Collection Time: 01/24/22  7:00 AM  ?Result Value Ref Range  ? WBC 6.8 4.0 - 10.5 K/uL  ? RBC 3.69 (L) 3.87 - 5.11 MIL/uL  ? Hemoglobin 9.4 (L) 12.0 - 15.0 g/dL  ? HCT 30.6 (L) 36.0 - 46.0 %  ? MCV 82.9 80.0 - 100.0 fL  ? MCH 25.5 (L) 26.0 - 34.0 pg  ? MCHC 30.7 30.0 - 36.0 g/dL  ? RDW 18.1 (H) 11.5 - 15.5 %  ? Platelets 202 150 - 400 K/uL  ? nRBC 0.0 0.0 - 0.2 %  ?Type and screen [facility]  ? Collection Time: 01/24/22  7:00 AM  ?Result Value Ref Range  ? ABO/RH(D) A POS   ? Antibody Screen NEG   ? Sample Expiration    ?  01/27/2022,2359 ?Performed at [facility], [address] [place], [place] [address] ?  ? ? ?Patient Active Problem List  ? Diagnosis Date Noted  ? Uterine contractions during pregnancy 01/24/2022  ? Anemia of mother in pregnancy, antepartum, third trimester 11/23/2021  ? Language barrier 11/15/2021  ? GBS bacteriuria 07/28/2021  ? Encounter for supervision of normal pregnancy 07/22/2021  ? ? ?Assessment/Plan:  ?Melanie [name] is a 26 y.o. G3P2002 at [redacted]w[redacted]d here for SOL.  ? ?#Labor: 8 cm with SROM upon evaluation, expect SVD shortly.  ?#Pain: Un-medicated  ?#FWB: Cat I  ?#ID: GBS positive, currently getting first dose of Amp  ?#MOF: breast and formula  ?#MOC: nexplanon  ?#Circ: NA ? ?Allayne Stack, DO  ?01/24/2022 ?  ?

## 2022-01-24 NOTE — Discharge Summary (Signed)
Postpartum Discharge Summary ? ?Patient Name: Melanie [name] ?DOB: 05/03/1996 ?MRN: [id number] ? ?Date of admission: 01/24/2022 ?Delivery date:01/24/2022  ?Delivering provider: Patriciaann Clan  ?Date of discharge: 01/26/2022 ? ?Admitting diagnosis: Uterine contractions during pregnancy [O47.9] ?Postpartum care following vaginal delivery [Z39.2] ?Intrauterine pregnancy: [redacted]w[redacted]d     ?Secondary diagnosis:  Principal Problem: ?  Uterine contractions during pregnancy ?Active Problems: ?  GBS bacteriuria ?  Language barrier ?  Anemia of mother in pregnancy, antepartum, third trimester (iron deficiency) ? ?Additional problems: None    ?Discharge diagnosis: Term Pregnancy Delivered                                              ?Post partum procedures: None ?Augmentation: N/A ?Complications: None ? ?Hospital course: Onset of Labor With Vaginal Delivery      ?26 y.o. yo G3P3003 at [redacted]w[redacted]d was admitted in Active Labor on 01/24/2022. Patient had an uncomplicated labor course as follows:  ?Membrane Rupture Time/Date: 8:45 AM ,01/24/2022   ?Delivery Method:Vaginal, Spontaneous  ?Episiotomy: None  ?Lacerations:  None  ?Patient had an uncomplicated postpartum course.  She is ambulating, tolerating a regular diet, passing flatus, and urinating well. Patient is discharged home in stable condition on 01/26/22. ? ?Newborn Data: ?Birth date:01/24/2022  ?Birth time:9:08 AM  ?Gender:Female  ?Living status:Living  ?Apgars:9 ,9  ?Weight:2700 g  ? ?Magnesium Sulfate received: No ?BMZ received: No ?Rhophylac:No ?MMR:No ?T-DaP:Given prenatally ?Flu: No ?Transfusion:No ? ?Physical exam  ?Vitals:  ? 01/25/22 0545 01/25/22 1400 01/25/22 2105 01/26/22 0623  ?BP: 115/75 104/63 108/71 99/61  ?Pulse: (!) 53 63 63 76  ?Resp: $Remov'18 18 16 15  'xCmPLF$ ?Temp: 98.4 ?F (36.9 ?C) 98.2 ?F (36.8 ?C) 98.4 ?F (36.9 ?C) 98.2 ?F (36.8 ?C)  ?TempSrc:  Oral Oral   ?SpO2: 100%  100% 100%  ? ?General: alert ?Lochia: appropriate ?Uterine Fundus: firm ?Incision: N/A ?DVT  Evaluation: No evidence of DVT seen on physical exam. ?Labs: ?Lab Results  ?Component Value Date  ? WBC 6.8 01/24/2022  ? HGB 9.4 (L) 01/24/2022  ? HCT 30.6 (L) 01/24/2022  ? MCV 82.9 01/24/2022  ? PLT 202 01/24/2022  ? ? ?  Latest Ref Rng & Units 09/04/2021  ?  3:27 PM  ?CMP  ?Glucose 70 - 99 mg/dL 90    ?BUN 6 - 20 mg/dL 6    ?Creatinine 0.57 - 1.00 mg/dL 0.40    ?Sodium 134 - 144 mmol/L 138    ?Potassium 3.5 - 5.2 mmol/L 3.6    ?Chloride 96 - 106 mmol/L 105    ?CO2 20 - 29 mmol/L 21    ?Calcium 8.7 - 10.2 mg/dL 8.8    ?Total Protein 6.0 - 8.5 g/dL 6.1    ?Total Bilirubin 0.0 - 1.2 mg/dL <0.2    ?Alkaline Phos 44 - 121 IU/L 40    ?AST 0 - 40 IU/L 17    ?ALT 0 - 32 IU/L 8    ? ?Edinburgh Score: ? ?  01/24/2022  ? 12:35 PM  ?Edinburgh Postnatal Depression Scale Screening Tool  ?I have been able to laugh and see the funny side of things. 1  ?I have looked forward with enjoyment to things. 0  ?I have blamed myself unnecessarily when things went wrong. 0  ?I have been anxious or worried for no good reason. 0  ?I have felt  scared or panicky for no good reason. 0  ?Things have been getting on top of me. 0  ?I have been so unhappy that I have had difficulty sleeping. 0  ?I have felt sad or miserable. 0  ?I have been so unhappy that I have been crying. 0  ?The thought of harming myself has occurred to me. 0  ?Edinburgh Postnatal Depression Scale Total 1  ? ? ? ?After visit meds:  ?Allergies as of 01/26/2022   ?No Known Allergies ?  ? ?  ?Medication List  ?  ? ?STOP taking these medications   ? ?hydrocortisone 2.5 % cream ?  ? ?  ? ?TAKE these medications   ? ?acetaminophen 325 MG tablet ?Commonly known as: Tylenol ?Take 2 tablets (650 mg total) by mouth every 4 (four) hours as needed (for pain scale < 4). ?  ?ferrous sulfate 325 (65 FE) MG tablet ?Take 1 tablet (325 mg total) by mouth every other day. ?  ?hydrOXYzine 25 MG capsule ?Commonly known as: Vistaril ?Take 1 capsule (25 mg total) by mouth 3 (three) times daily as  needed for itching. ?  ?ibuprofen 600 MG tablet ?Commonly known as: ADVIL ?Take 1 tablet (600 mg total) by mouth every 6 (six) hours. ?  ?loratadine 10 MG tablet ?Commonly known as: CLARITIN ?Take 1 tablet (10 mg total) by mouth daily. ?  ?norethindrone 0.35 MG tablet ?Commonly known as: Ortho Micronor ?Take 1 tablet (0.35 mg total) by mouth daily. ?  ?Prenatal Complete 14-0.4 MG Tabs ?Take 1 tablet by mouth daily. ?  ? ?  ? ? ? ?Discharge home in stable condition ?Infant Feeding: Bottle and Breast ?Infant Disposition:home with mother ?Discharge instruction: per After Visit Summary and Postpartum booklet. ?Activity: Advance as tolerated. Pelvic rest for 6 weeks.  ?Diet: routine diet ?Future Appointments: ?Future Appointments  ?Date Time Provider [place]  ?01/30/2022  1:30 PM Megan Salon, MD [facility]  ?02/06/2022 11:15 AM Megan Salon, MD [facility]  ? ?Follow up Visit: ?Message sent to [facility] by Dr Higinio Plan:  ?Please schedule this patient for a In person postpartum visit in 6 weeks with the following provider: Any provider. ?Additional Postpartum F/U: None   ?Low risk pregnancy complicated by:  None ?Delivery mode:  Vaginal, Spontaneous  ?Anticipated Birth Control:  Nexplanon at 6 wk visit, wants POPs in the mean time. ? ?Renard Matter, MD, MPH ?OB Fellow, Faculty Practice ? ? ? ?

## 2022-01-24 NOTE — Lactation Note (Signed)
This note was copied from a baby's chart. ?Lactation Consultation Note ? ?Patient Name: Melanie [name] ?Today's Date: 01/24/2022 ?Reason for consult: L&D Initial assessment ?Age:28 hours ? ?Interpreter used via video for Arabic. ?Per RN mother has already latched baby on both breasts prior to [facility] arriving. ?Mother concerned about her milk supply. ?Encouraged breastfeeding before offering formula to help establish her milk supply. ?Lactation to follow up on MBU.  ? ?Maternal Data ?Does the patient have breastfeeding experience prior to this delivery?: Yes ?How long did the patient breastfeed?: 3 mos. ? ?Feeding ?Mother's Current Feeding Choice: Breast Milk and Formula ? ?Interventions ?Interventions: Education ?Consult Status ?Consult Status: Follow-up from L&D ? ? ? ?Dahlia Byes Boschen ?01/24/2022, 10:51 AM ? ? ? ?

## 2022-01-24 NOTE — MAU Note (Signed)
..  Melanie [name] is a 26 y.o. at [redacted]w[redacted]d here in MAU reporting via EMS: contractions and urge to push. Denies leaking of fluid. Reports some bloody show.  ? ?Pain score: faces 6 ?Vitals:  ? 01/24/22 0558  ?BP: 119/79  ?Pulse: 62  ?Resp: 15  ?Temp: 98.3 ?F (36.8 ?C)  ?SpO2: 100%  ?   ?QAS:TMHDQQI in room 120's ? ?

## 2022-01-24 NOTE — Progress Notes (Signed)
Admission education and questions were interpreted using Arabic Interpreter: Riham #[id number].  Pt verbalized understanding of care plan, safe sleep, and medication education for infant. ?

## 2022-01-25 LAB — RPR: RPR Ser Ql: NONREACTIVE

## 2022-01-25 NOTE — Lactation Note (Deleted)
This note was copied from a baby's chart. ?Lactation Consultation Note ?Fob interpreter for mom. Mom was BF when LC went into room. Mom told FOb BF going well she didn't have any questions. ?Mom BF her 1st child for 9 months and her 2nd child for 2 yrs. ?LC suggested to hold baby STS and turn baby's body towards mom.  ?Encouraged if needs assistance or has questions to call. ? ?Patient Name: Melanie [name] ?Today's Date: 01/25/2022 ?Reason for consult: Initial assessment ?Age:28 hours ? ?Maternal Data ?  ? ?Feeding ?  ? ?LATCH Score ?Latch: Grasps breast easily, tongue down, lips flanged, rhythmical sucking. ? ?Audible Swallowing: None ? ?Type of Nipple: Everted at rest and after stimulation ? ?Comfort (Breast/Nipple): Soft / non-tender ? ?Hold (Positioning): No assistance needed to correctly position infant at breast. ? ?LATCH Score: 8 ? ? ?Lactation Tools Discussed/Used ?  ? ?Interventions ?Interventions: LC Services brochure;Breast feeding basics reviewed ? ?Discharge ?  ? ?Consult Status ?Consult Status: Complete ? ? ? ?Charyl Dancer ?01/25/2022, 2:48 AM ? ? ? ?

## 2022-01-25 NOTE — Lactation Note (Signed)
This note was copied from a baby's chart. ?Lactation Consultation Note ? ?Patient Name: Melanie [name] ?Today's Date: 01/25/2022 ?Reason for consult: Follow-up assessment;Early term 37-38.6wks;Infant < 6lbs ?Age:28 hours ? ? ?P3 mother whose infant is now 47 hours old.  This is an ETI at 38+1 weeks weighing < 6 lbs.  Mother's feeding preference is breast/formula.  Baby has a 7% weight loss this morning. ? ?Arabic interpreter, Melanie [name] [phone]) used for interpretation. ? ?Baby "Melanie [name]" was swaddled and asleep in the bassinet when I arrived.  Reviewed mother's current feeding plan.  Mother stated she doesn't have any milk and has been feeding large amounts of formula.  Encouraged to continue latching with every feeding prior to giving formula supplementation.  I need to question the amount of weight loss at 7% at approximately 20 hours of life which is the time the baby would have been weighed.  Baby has had 4 voids and 5 stools but has consumed appropriate formula volumes.  Continue to encourage hand expression and feeding back any EBM mother obtains.  She had no questions on formula feeding or pumping at this time.  Mother will continue to feed at least every three hours or sooner if baby shows cues.  She may call for latch assistance as needed. ? ?No support person present at this time.  Ordered mother's breakfast for her.  RN updated. ? ? ?Maternal Data ?Has patient been taught Hand Expression?: Yes ?Does the patient have breastfeeding experience prior to this delivery?: Yes ?How long did the patient breastfeed?: 3-4 months with her other two children ? ?Feeding ?Mother's Current Feeding Choice: Breast Milk and Formula ?Nipple Type: Nfant Standard Flow (white) ? ?LATCH Score ?Latch: Grasps breast easily, tongue down, lips flanged, rhythmical sucking. ? ?Audible Swallowing: A few with stimulation ? ?Type of Nipple: Everted at rest and after stimulation ? ?Comfort (Breast/Nipple): Soft /  non-tender ? ?Hold (Positioning): No assistance needed to correctly position infant at breast. ? ?LATCH Score: 9 ? ? ?Lactation Tools Discussed/Used ?Tools: Pump;Flanges ?Flange Size: 24 ?Breast pump type: Double-Electric Breast Pump;Manual ?Reason for Pumping: ETI and < 6 pounds; breast stimulation for supplementation ?Pumping frequency: Every three hours ? ?Interventions ?Interventions: Breast feeding basics reviewed;Hand express;DEBP;Education ? ?Discharge ?  ? ?Consult Status ?Consult Status: Follow-up ?Date: 01/26/22 ?Follow-up type: In-patient ? ? ? ?Melanie [name] R Melanie [name] ?01/25/2022, 9:11 AM ? ? ? ?

## 2022-01-25 NOTE — Lactation Note (Signed)
This note was copied from a baby's chart. ?Lactation Consultation Note ?FOB interpret for mom. He understood English well. ?Mom requested formula. Mom's feeding choice is breast/formula feeding. ?Baby is less than 6 lbs so LC recommended supplementing after BF. LC also recommended pump and giving back any colostrum collected. Mom encouraged to feed baby 8-12 times/24 hours and with feeding cues.  Mom shown how to use DEBP & how to disassemble, clean, & reassemble parts. Mom knows to pump q3h for 15-20 min. Mom pumped w/no colostrum noted. ?Mom demonstrated hand expression noted colostrum. Praised mom. ?Mom BF her other children for 3 months. ?Mom is cramping from BF and pumping. ?Encouraged mom to call for assistance as needed. ?Patient Name: Melanie [name] ?Today's Date: 01/25/2022 ?Reason for consult: Initial assessment ?Age:48 hours ? ?Maternal Data ?  ? ?Feeding ?  ? ?LATCH Score ?Latch: Grasps breast easily, tongue down, lips flanged, rhythmical sucking. ? ?Audible Swallowing: None ? ?Type of Nipple: Everted at rest and after stimulation ? ?Comfort (Breast/Nipple): Soft / non-tender ? ?Hold (Positioning): No assistance needed to correctly position infant at breast. ? ?LATCH Score: 8 ? ? ?Lactation Tools Discussed/Used ?  ? ?Interventions ?Interventions: LC Services brochure;Breast feeding basics reviewed ? ?Discharge ?  ? ?Consult Status ?Consult Status: Complete ? ? ? ?Charyl Dancer ?01/25/2022, 3:25 AM ? ? ? ?

## 2022-01-25 NOTE — Progress Notes (Signed)
POSTPARTUM PROGRESS NOTE ? ?Subjective: Melanie [name] is a 26 y.o. D3U2025 PPD#1 s/p SVD at [redacted]w[redacted]d.  She reports she doing well. No acute events overnight. She denies any problems with ambulating, voiding or po intake. Denies nausea or vomiting. She has not yet passed flatus. Pain is well controlled.  Lochia is scant. ? ?Objective: ?Blood pressure 115/75, pulse (!) 53, temperature 98.4 ?F (36.9 ?C), resp. rate 18, last menstrual period 05/02/2021, SpO2 100 %, unknown if currently breastfeeding. ? ?Physical Exam:  ?General: alert, cooperative and no distress ?Chest: no respiratory distress ?Abdomen: soft, non-tender  ?Uterine Fundus: firm, appropriately tender ?Extremities: No calf swelling or tenderness  no edema ? ?Recent Labs  ?  01/24/22 ?0700  ?HGB 9.4*  ?HCT 30.6*  ? ? ?Assessment/Plan: ?Melanie [name] is a 26 y.o. K2H0623 PPD#1 s/p SVD at [redacted]w[redacted]d ? ?Routine Postpartum Care: Doing well, pain well-controlled.  ?-- Continue routine care, lactation support  ?-- Contraception: plans outpatient Nexplanon ?-- Feeding: both ? ?Dispo: Plan for discharge PPD#2 as baby needs to stay for further monitoring (GBS positive, not adequately treated). ? ?Warner Mccreedy, MD, MPH ?OB Fellow, Faculty Practice ?Center for [facility]  ? ? ?

## 2022-01-26 MED ORDER — ACETAMINOPHEN 325 MG PO TABS: 650.0000 mg | ORAL_TABLET | ORAL | 0 refills | Status: DC | PRN

## 2022-01-26 MED ORDER — NORETHINDRONE 0.35 MG PO TABS
1.0000 | ORAL_TABLET | Freq: Every day | ORAL | 0 refills | Status: DC
Start: 2022-01-26 — End: 2022-03-11

## 2022-01-26 MED ORDER — PRENATAL COMPLETE 14-0.4 MG PO TABS: 1.0000 | ORAL_TABLET | Freq: Every day | ORAL | 0 refills | Status: DC

## 2022-01-26 MED ORDER — IBUPROFEN 600 MG PO TABS: 600.0000 mg | ORAL_TABLET | Freq: Four times a day (QID) | ORAL | 0 refills | Status: DC

## 2022-01-30 ENCOUNTER — Encounter: Payer: BC Managed Care – PPO | Admitting: Obstetrics and Gynecology

## 2022-01-30 ENCOUNTER — Encounter (HOSPITAL_BASED_OUTPATIENT_CLINIC_OR_DEPARTMENT_OTHER): Payer: BC Managed Care – PPO | Admitting: Obstetrics & Gynecology

## 2022-01-30 ENCOUNTER — Encounter (HOSPITAL_BASED_OUTPATIENT_CLINIC_OR_DEPARTMENT_OTHER): Payer: Self-pay

## 2022-02-03 ENCOUNTER — Telehealth (HOSPITAL_COMMUNITY): Payer: Self-pay | Admitting: *Deleted

## 2022-02-03 NOTE — Telephone Encounter (Signed)
Voicemail not setup. Unable to leave message. ? ?Duffy Rhody, RN 02-03-2022 at 3:11pm ?

## 2022-02-06 ENCOUNTER — Encounter (HOSPITAL_BASED_OUTPATIENT_CLINIC_OR_DEPARTMENT_OTHER): Payer: BC Managed Care – PPO | Admitting: Obstetrics & Gynecology

## 2022-03-11 ENCOUNTER — Encounter (HOSPITAL_BASED_OUTPATIENT_CLINIC_OR_DEPARTMENT_OTHER): Payer: Self-pay | Admitting: Medical

## 2022-03-11 ENCOUNTER — Ambulatory Visit (INDEPENDENT_AMBULATORY_CARE_PROVIDER_SITE_OTHER): Payer: Medicaid Other | Admitting: Medical

## 2022-03-11 DIAGNOSIS — Z30017 Encounter for initial prescription of implantable subdermal contraceptive: Secondary | ICD-10-CM

## 2022-03-11 DIAGNOSIS — D508 Other iron deficiency anemias: Secondary | ICD-10-CM | POA: Diagnosis not present

## 2022-03-11 MED ORDER — ETONOGESTREL 68 MG ~~LOC~~ IMPL
68.0000 mg | DRUG_IMPLANT | Freq: Once | SUBCUTANEOUS | Status: AC
  Administered 2022-03-11: 68 mg via SUBCUTANEOUS

## 2022-03-11 NOTE — Addendum Note (Signed)
Addended by: Ina Homes B on: 03/11/2022 03:19 PM   Modules accepted: Orders

## 2022-03-11 NOTE — Progress Notes (Signed)
Post Partum Visit Note  Melanie [name] is a 26 y.o. [phone] female who presents for a postpartum visit. She is 6 weeks postpartum following a normal spontaneous vaginal delivery.  I have fully reviewed the prenatal and intrapartum course. The delivery was at 38.1 gestational weeks.  Anesthesia: none. Postpartum course has been uncomplicated. Baby is doing well. Baby is feeding by both breast and bottle - Enfamil Gentle Ease . Bleeding no bleeding. Bowel function is abnormal: constipation . Bladder function is normal. Patient is not sexually active. Contraception method is abstinence. Postpartum depression screening: negative.   Upstream - 03/11/22 6387       Pregnancy Intention Screening   Does the patient want to become pregnant in the next year? No    Does the patient's partner want to become pregnant in the next year? Unsure    Would the patient like to discuss contraceptive options today? Yes      Contraception Wrap Up   Current Method Abstinence    End Method Hormonal Implant    Contraception Counseling Provided Yes            The pregnancy intention screening data noted above was reviewed. Potential methods of contraception were discussed. The patient elected to proceed with Hormonal Implant.   Edinburgh Postnatal Depression Scale - 03/11/22 0856       Edinburgh Postnatal Depression Scale:  In the Past 7 Days   I have been able to laugh and see the funny side of things. 0    I have looked forward with enjoyment to things. 0    I have blamed myself unnecessarily when things went wrong. 0    I have been anxious or worried for no good reason. 2    I have felt scared or panicky for no good reason. 0    Things have been getting on top of me. 0    I have been so unhappy that I have had difficulty sleeping. 0    I have felt sad or miserable. 0    I have been so unhappy that I have been crying. 0    The thought of harming myself has occurred to me. 0     Edinburgh Postnatal Depression Scale Total 2             Health Maintenance Due  Topic Date Due   COVID-19 Vaccine (1) Never done    The following portions of the patient's history were reviewed and updated as appropriate: allergies, current medications, past family history, past medical history, past social history, past surgical history, and problem list.  Review of Systems Pertinent items are noted in HPI.  Objective:  BP 101/71 (BP Location: Right Arm, Patient Position: Sitting, Cuff Size: Normal)   Pulse 79   Wt 113 lb 6.4 oz (51.4 kg)   LMP 05/02/2021   Breastfeeding Yes   BMI 21.43 kg/m    General:  alert and cooperative   Breasts:  not indicated  Lungs: clear to auscultation bilaterally  Heart:  regular rate and rhythm, S1, S2 normal, no murmur, click, rub or gallop  Abdomen: Soft, non-tender    Wound N/A  GU exam:  not indicated       GYNECOLOGY CLINIC PROCEDURE NOTE  Nexplanon Insertion Procedure Patient was given informed consent, she signed consent form.  Patient does understand that irregular bleeding is a very common side effect of this medication. She was advised to have backup contraception for one  week after placement. Pregnancy test in clinic today was negative.  Appropriate time out taken.  Patient's left arm was prepped and draped in the usual sterile fashion.. The ruler used to measure and mark insertion area.  Patient was prepped with alcohol swab and then injected with 3 ml of 1% lidocaine.  She was prepped with betadine, Nexplanon removed from packaging,  Device confirmed in needle, then inserted full length of needle and withdrawn per handbook instructions. Nexplanon was able to palpated in the patient's arm; patient palpated the insert herself. There was minimal blood loss.  Patient insertion site covered with guaze and a pressure bandage to reduce any bruising.  The patient tolerated the procedure well and was given post procedure instructions.     Assessment:    1. Routine postpartum follow-up  Normal postpartum exam.   Plan:   Essential components of care per ACOG recommendations:  1.  Mood and well being: Patient with negative depression screening today. Reviewed local resources for support.  - Patient tobacco use? No.   - hx of drug use? No.    2. Infant care and feeding:  -Patient currently breastmilk feeding? Yes. Reviewed importance of draining breast regularly to support lactation.  -Social determinants of health (SDOH) reviewed in EPIC. No concerns  3. Sexuality, contraception and birth spacing - Patient does not want a pregnancy in the next year.  Desired family size is 3 children.  - Reviewed reproductive life planning. Reviewed contraceptive methods based on pt preferences and effectiveness.  Patient desired Hormonal Implant today.   - Discussed birth spacing of 18 months  4. Sleep and fatigue -Encouraged family/partner/community support of 4 hrs of uninterrupted sleep to help with mood and fatigue  5. Physical Recovery  - Discussed patients delivery and complications. She describes her labor as good. - Patient had a Vaginal, no problems at delivery. Patient had  no  laceration. Perineal healing reviewed. Patient expressed understanding - Patient has urinary incontinence? No. - Patient is safe to resume physical and sexual activity  6.  Health Maintenance - HM due items addressed Yes - Last pap smear  Diagnosis  Date Value Ref Range Status  08/07/2021   Final   - Negative for intraepithelial lesion or malignancy (NILM)   Pap smear not done at today's visit.  -Breast Cancer screening indicated? No.   7. Chronic Disease/Pregnancy Condition follow up: Anemia - PCP follow up recommended  Vonzella Nipple, PA-C Center for [employer], [facility]

## 2022-03-18 ENCOUNTER — Other Ambulatory Visit: Payer: Self-pay | Admitting: Family Medicine

## 2022-11-17 ENCOUNTER — Encounter (HOSPITAL_BASED_OUTPATIENT_CLINIC_OR_DEPARTMENT_OTHER): Payer: Self-pay | Admitting: Obstetrics & Gynecology

## 2022-11-17 ENCOUNTER — Ambulatory Visit (INDEPENDENT_AMBULATORY_CARE_PROVIDER_SITE_OTHER): Payer: Medicaid Other | Admitting: Obstetrics & Gynecology

## 2022-11-17 ENCOUNTER — Other Ambulatory Visit (HOSPITAL_BASED_OUTPATIENT_CLINIC_OR_DEPARTMENT_OTHER): Payer: Self-pay

## 2022-11-17 VITALS — BP 98/71 | HR 80 | Ht 61.0 in | Wt 104.6 lb

## 2022-11-17 DIAGNOSIS — N926 Irregular menstruation, unspecified: Secondary | ICD-10-CM | POA: Diagnosis not present

## 2022-11-17 DIAGNOSIS — Z789 Other specified health status: Secondary | ICD-10-CM

## 2022-11-17 DIAGNOSIS — Z3046 Encounter for surveillance of implantable subdermal contraceptive: Secondary | ICD-10-CM

## 2022-11-17 DIAGNOSIS — Z30011 Encounter for initial prescription of contraceptive pills: Secondary | ICD-10-CM | POA: Diagnosis not present

## 2022-11-17 MED ORDER — NORETHIN ACE-ETH ESTRAD-FE 1-20 MG-MCG PO TABS
1.0000 | ORAL_TABLET | Freq: Every day | ORAL | 0 refills | Status: DC
  Filled 2022-11-17: qty 28, 28d supply, fill #0

## 2022-11-17 MED ORDER — NORETHIN ACE-ETH ESTRAD-FE 1-20 MG-MCG PO TABS: 1.0000 | ORAL_TABLET | Freq: Every day | ORAL | 3 refills | Status: DC

## 2022-11-17 NOTE — Progress Notes (Unsigned)
GYNECOLOGY  VISIT  CC:   irregular bleeding  HPI: 27 y.o. G52P3003 Married Other or two or more races female here for complaint of bleeding that has been present for about a month.  She had a Nexplanon placed post partum by Kerry Hough on 03/11/2022.  Bleeding has been irregular and this creates difficulty for her as she is unable to pray due to bleeding.  She wants the nexplanon removed and to start OCPs.  She's taken pills in the past and did well with this.  Doesn't feel like this will be a problem to take as she's used a combination pill in the past.  Stopped breast feeding after 3 months.  Never had blood pressure issues with OCPs in the past.  Risks discussed with pt in detail including DUB, DVT/PE, headache, nausea, increased BP.  Instruction sheet provided and reviewed with pt when to start and what to do if misses pill/pills.  Arabic translator Princess Perna [phone].     Past Medical History:  Diagnosis Date   Medical history non-contributory     MEDS:   Current Outpatient Medications on File Prior to Visit  Medication Sig Dispense Refill   ferrous sulfate 325 (65 FE) MG tablet Take 1 tablet (325 mg total) by mouth every other day. 30 tablet 3   ibuprofen (ADVIL) 600 MG tablet Take 1 tablet (600 mg total) by mouth every 6 (six) hours. 30 tablet 0   Prenatal Vit-Fe Fumarate-FA (PRENATAL COMPLETE) 14-0.4 MG TABS Take 1 tablet by mouth daily. 90 tablet 0   VITAMIN D, CHOLECALCIFEROL, PO Take by mouth.     hydrOXYzine (VISTARIL) 25 MG capsule Take 1 capsule (25 mg total) by mouth 3 (three) times daily as needed for itching. (Patient not taking: Reported on 12/04/2021) 30 capsule 0   loratadine (CLARITIN) 10 MG tablet Take 1 tablet (10 mg total) by mouth daily. (Patient not taking: Reported on 03/11/2022) 30 tablet 1   No current facility-administered medications on file prior to visit.    ALLERGIES: Patient has no known allergies.  SH:  married, non smoker  Review of Systems  Constitutional:  Negative.   Genitourinary:        Irregular bleeding    PHYSICAL EXAMINATION:    BP 98/71   Pulse 80   Ht 5' 1"$  (1.549 m)   Wt 104 lb 9.6 oz (47.4 kg)   BMI 19.76 kg/m     Gen:  WNWD WF, NAD  Procedure: Patient placed supine on exam table with her left arm flexed at the elbow and externally rotated. The prior insertion site was located and the Nexplanon rod was palpated.  Area cleansed with Betadine x 3 and draped in normal sterile fashion.  Insertion site and surrounding tissue anesthetized with 1% Lidocaine without epinephrine, 1.5cc total used.  Small incision made with #11 blade.  Capsule around rod was dissected with #11 blade, releasing the Nexplanon rod.  This was then removed without difficulty using hemostat.  Small amount of bleeding was noted and made hemostatic with pressure.  Steri-strips were applied and pressure dressing placed over the site.  Entire procedure performed with sterile technique.  Pt tolerated procedure well.  Assessment/Plan: 1. Nexplanon removal - pt tolerated procedure well  2. Encounter for initial prescription of contraceptive pills - will start OCPs today.  Advised pt it may take a few weeks until her bleeding stops - norethindrone-ethinyl estradiol-FE (LOESTRIN FE) 1-20 MG-MCG tablet; Take 1 tablet by mouth daily.  Dispense: 84 tablet;  Refill: 0  3. Irregular bleeding

## 2022-11-20 ENCOUNTER — Encounter (HOSPITAL_BASED_OUTPATIENT_CLINIC_OR_DEPARTMENT_OTHER): Payer: Self-pay | Admitting: Obstetrics & Gynecology

## 2022-11-20 DIAGNOSIS — Z789 Other specified health status: Secondary | ICD-10-CM | POA: Insufficient documentation

## 2022-11-20 DIAGNOSIS — N926 Irregular menstruation, unspecified: Secondary | ICD-10-CM | POA: Insufficient documentation

## 2022-12-11 IMAGING — US US MFM OB FOLLOW-UP
1 series · 13 of 28 positions shown · non-contrast
Comparison: none

[Series 1: us mfm ob follow-up · 13 of 29 slices shown]
[im 2/29]
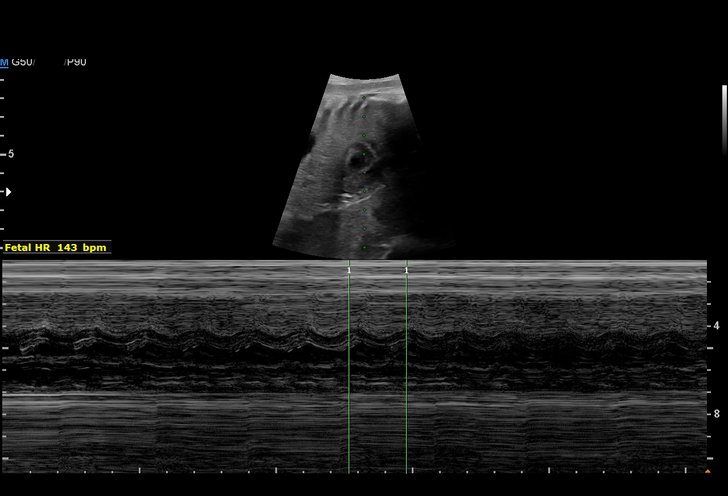
[im 4/29]
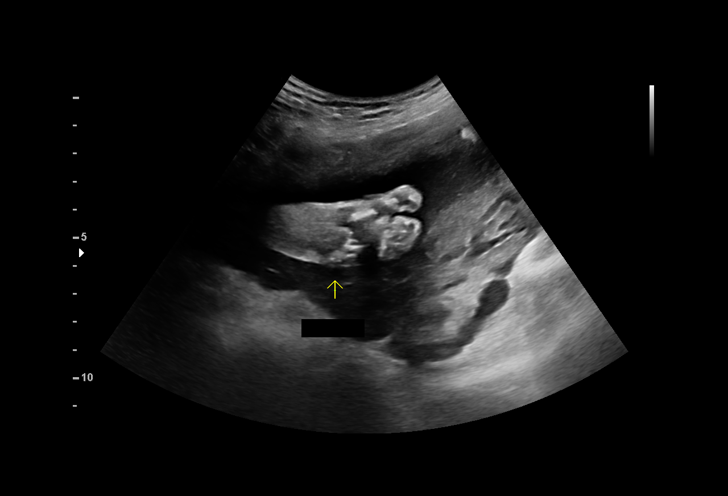
[im 6/29]
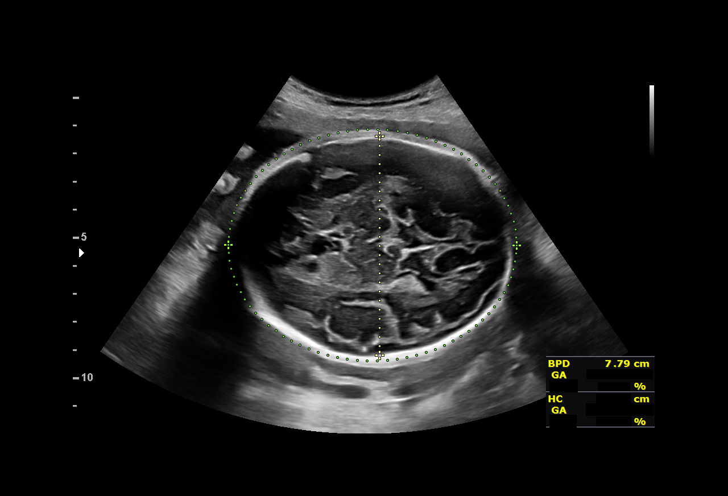
[im 8/29]
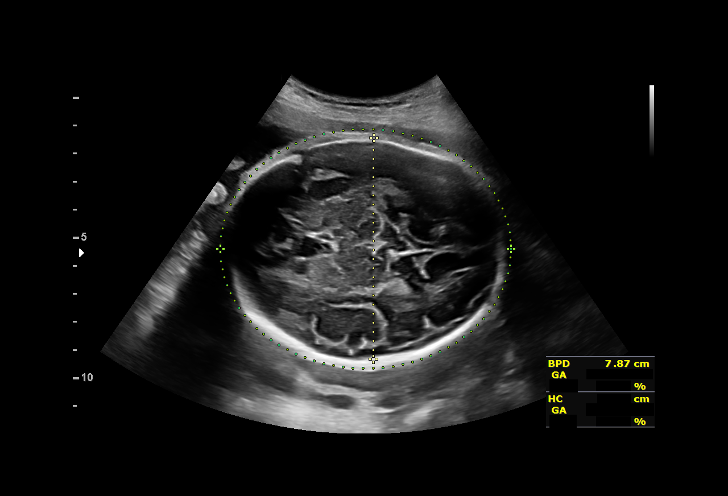
[im 10/29]
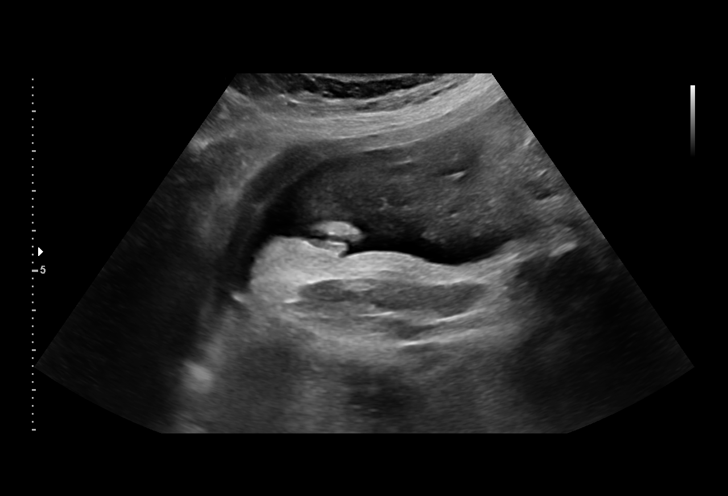
[im 12/29]
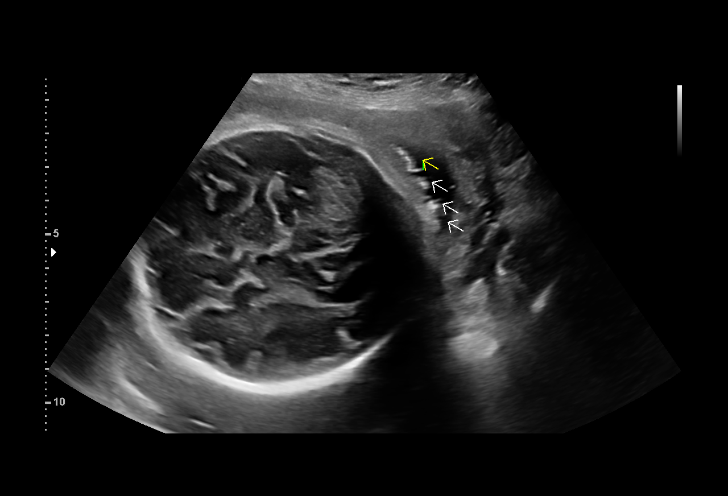
[im 15/29]
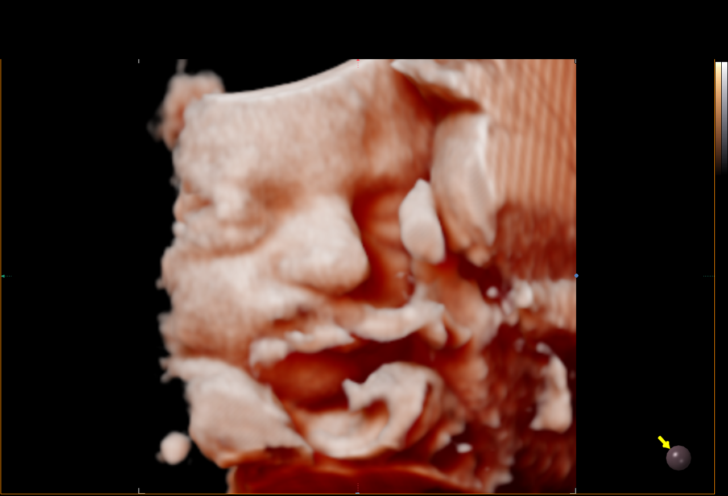
[im 17/29]
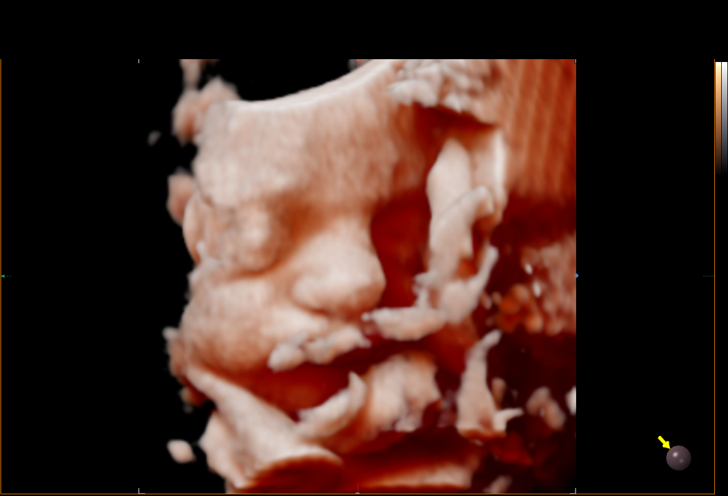
[im 19/29]
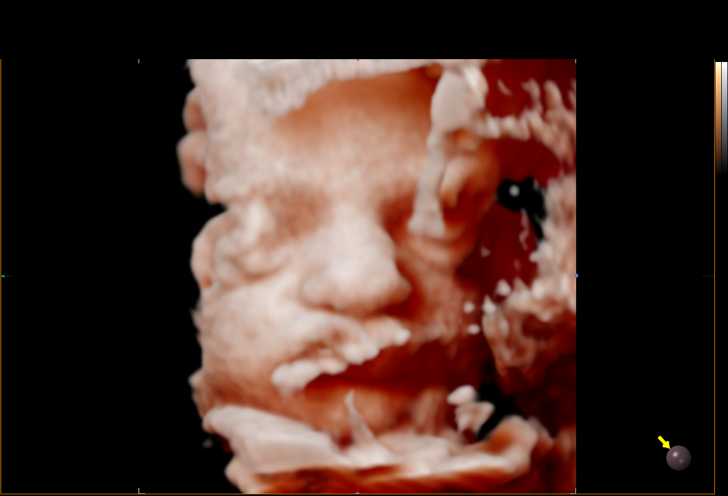
[im 21/29]
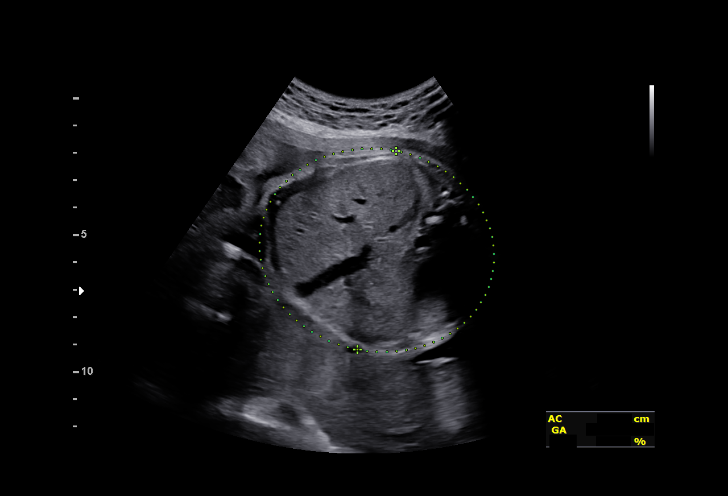
[im 23/29]
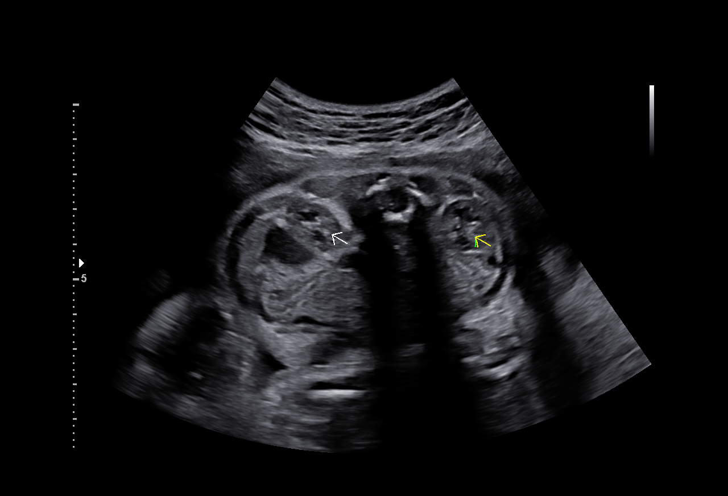
[im 25/29]
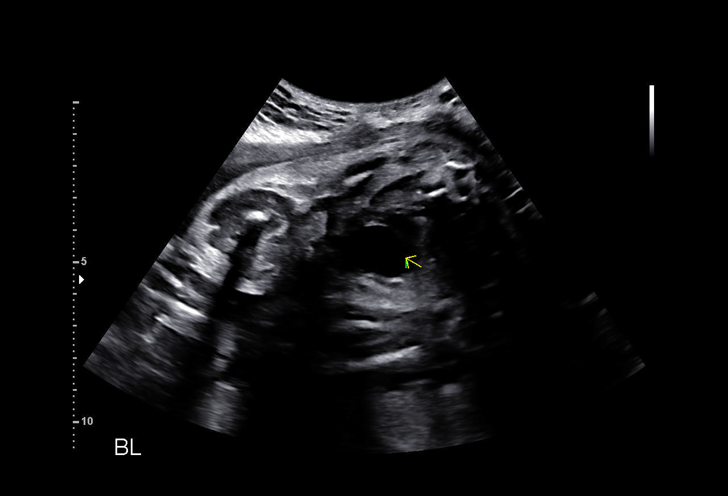
[im 27/29]
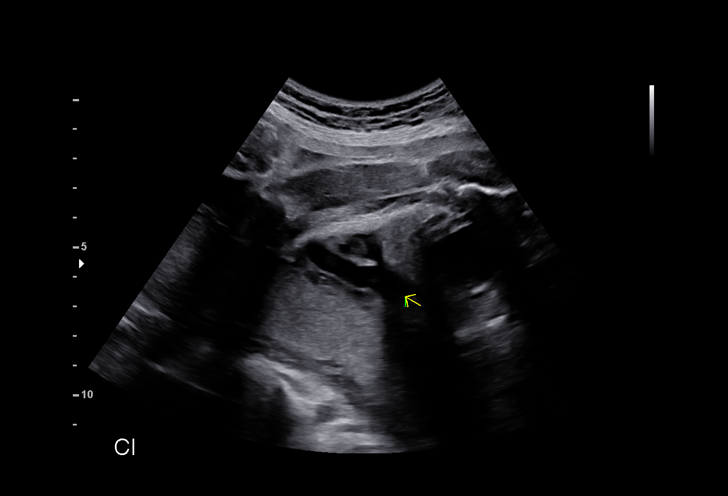

[13 of 28 positions shown; findings below may reference images not displayed]

SIADEN

                                                      FORGIDZA

Indications

 Antenatal follow-up for nonvisualized fetal
 anatomy
 29 weeks gestation of pregnancy
 Negative AFP
Fetal Evaluation

 Num Of Fetuses:         1
 Fetal Heart Rate(bpm):  143
 Cardiac Activity:       Observed
 Presentation:           Cephalic
 Placenta:               Posterior
 P. Cord Insertion:      Previously Visualized

 Amniotic Fluid
 AFI FV:      Within normal limits

 AFI Sum(cm)     %Tile       Largest Pocket(cm)
 13.05           38

 RUQ(cm)       RLQ(cm)       LUQ(cm)        LLQ(cm)

Biometry

 BPD:      78.4  mm     G. Age:  31w 3d         89  %    CI:        72.48   %    70 - 86
                                                         FL/HC:      18.9   %    19.2 -
 HC:      292.9  mm     G. Age:  32w 2d         89  %    HC/AC:      1.15        0.99 -
 AC:      255.1  mm     G. Age:  29w 5d         48  %    FL/BPD:     70.8   %    71 - 87
 FL:       55.5  mm     G. Age:  29w 2d         26  %    FL/AC:      21.8   %    20 - 24
 LV:        7.1  mm
 Est. FW:    3980  gm      3 lb 4 oz     50  %
OB History

 Blood Type:   A+
 Gravidity:    3         Term:   2        Prem:   0        SAB:   0
 TOP:          0       Ectopic:  0        Living: 2
Gestational Age

 LMP:           29w 4d        Date:  05/02/21                 EDD:   02/06/22
 U/S Today:     30w 5d                                        EDD:   01/29/22
 Best:          29w 4d     Det. By:  LMP  (05/02/21)          EDD:   02/06/22
Anatomy

 Cranium:               Appears normal         Aortic Arch:            Previously seen
 Cavum:                 Previously seen        Ductal Arch:            Previously seen
 Ventricles:            Appears normal         Diaphragm:              Previously seen
 Choroid Plexus:        Previously seen        Stomach:                Appears normal, left
                                                                       sided
 Cerebellum:            Previously seen        Abdomen:                Previously seen
 Posterior Fossa:       Previously seen        Abdominal Wall:         Appears nml (cord
                                                                       insert, abd wall)
 Nuchal Fold:           Previously seen        Cord Vessels:           Appears normal (3
                                                                       vessel cord)
 Face:                  Orbits and profile     Kidneys:                Appear normal
                        previously seen
 Lips:                  Previously seen        Bladder:                Appears normal
 Thoracic:              Previously seen        Spine:                  Previously seen
 Heart:                 Appears normal         Upper Extremities:      Previously seen
                        (4CH, axis, and
                        situs)
 RVOT:                  Previously seen        Lower Extremities:      Previously seen
 LVOT:                  Previously seen

 Other:  Fetus appears to be female. VC, 3VV and 3VTV previously visualized.
Cervix Uterus Adnexa

 Cervix
 Not visualized (advanced GA >48wks)

 Right Ovary
 Visualized.

 Left Ovary
 Visualized.
Comments

 This patient was seen for a follow up growth scan as the
 views of the fetal anatomy were unable to be visualized
 during her last exam.  She denies any problems since her
 last exam.
 She was informed that the fetal growth and amniotic fluid
 level appears appropriate for her gestational age.
 The views of the fetal anatomy were visualized today.
 Today's exam was based on a review of the images obtained
 by the sonographer as I was not allowed to scan her myself
 due to her religious beliefs.
 All conversations were held with the patient today with the
 help of an Arabic interpreter.

## 2023-05-06 ENCOUNTER — Other Ambulatory Visit: Payer: Self-pay

## 2023-05-06 ENCOUNTER — Encounter (HOSPITAL_COMMUNITY): Payer: Self-pay | Admitting: Emergency Medicine

## 2023-05-06 ENCOUNTER — Emergency Department (HOSPITAL_COMMUNITY)
Admission: EM | Admit: 2023-05-06 | Discharge: 2023-05-06 | Disposition: A | Discharge status: Home / Self Care | Payer: Medicaid Other | Source: Home / Self Care

## 2023-05-06 DIAGNOSIS — L0201 Cutaneous abscess of face: Secondary | ICD-10-CM | POA: Insufficient documentation

## 2023-05-06 MED ORDER — SULFAMETHOXAZOLE-TRIMETHOPRIM 800-160 MG PO TABS: 1.0000 | ORAL_TABLET | Freq: Two times a day (BID) | ORAL | 0 refills | Status: AC

## 2023-05-06 NOTE — Discharge Instructions (Signed)
You were evaluated today for a skin abscess. I have prescribed antibiotics. If your abscess does not improve over the next week or worsens, you may need incision and drainage of the area.

## 2023-05-06 NOTE — ED Notes (Signed)
Assumed care of patient here reporting chin abscess x 2 days that is draining clear fluid. Pt a/o x 4 respirations even and non labored vs wnl

## 2023-05-06 NOTE — ED Provider Notes (Signed)
[place] EMERGENCY DEPARTMENT AT [facility] Provider Note   CSN: [id number] Arrival date & time: 05/06/23  1954     History  Chief Complaint  Patient presents with   Abscess    Melanie [name] is a 27 y.o. female.  Patient presents to the emergency department complaining of an abscess to the right of her face on the chin area which appeared 2 days ago.  The area has been draining clearish fluid.  Patient denies fevers, nausea, vomiting, other systemic symptoms.  Patient is breast-feeding at this time.  Past medical history significant for anemia  Family at bedside interprets for patient at patient request. Video interpreter offered.   HPI     Home Medications Prior to Admission medications   Medication Sig Start Date End Date Taking? Authorizing Provider  sulfamethoxazole-trimethoprim (BACTRIM DS) 800-160 MG tablet Take 1 tablet by mouth 2 (two) times daily for 5 days. 05/06/23 05/11/23 Yes Darrick Grinder, PA-C  ferrous sulfate 325 (65 FE) MG tablet Take 1 tablet (325 mg total) by mouth every other day. 01/02/22   Jerene Bears, MD  ibuprofen (ADVIL) 600 MG tablet Take 1 tablet (600 mg total) by mouth every 6 (six) hours. 01/26/22   Warner Mccreedy, MD  norethindrone-ethinyl estradiol-FE (LOESTRIN FE) 1-20 MG-MCG tablet Take 1 tablet by mouth daily. 11/17/22   Jerene Bears, MD  Prenatal Vit-Fe Fumarate-FA (PRENATAL COMPLETE) 14-0.4 MG TABS Take 1 tablet by mouth daily. 01/26/22   Warner Mccreedy, MD  VITAMIN D, CHOLECALCIFEROL, PO Take by mouth.    [provider]      Allergies    Patient has no known allergies.    Review of Systems   Review of Systems  Physical Exam Updated Vital Signs BP 104/75 (BP Location: Right Arm)   Pulse 83   Temp 98 F (36.7 C) (Oral)   Resp 18   Ht 5\' 1"  (1.549 m)   Wt 47 kg   SpO2 100%   BMI 19.58 kg/m  Physical Exam Vitals and nursing note reviewed.  HENT:     Head: Normocephalic and atraumatic.  Eyes:      Pupils: Pupils are equal, round, and reactive to light.  Pulmonary:     Effort: Pulmonary effort is normal. No respiratory distress.  Musculoskeletal:        General: No signs of injury.     Cervical back: Normal range of motion.  Skin:    General: Skin is dry.     Comments: Patient with erythematous fluctuant area on right chin, draining small amount of clear fluid  Neurological:     Mental Status: She is alert.  Psychiatric:        Speech: Speech normal.        Behavior: Behavior normal.     ED Results / Procedures / Treatments   Labs (all labs ordered are listed, but only abnormal results are displayed) Labs Reviewed - No data to display  EKG None  Radiology No results found.  Procedures Procedures    Medications Ordered in ED Medications - No data to display  ED Course/ Medical Decision Making/ A&P                                 Medical Decision Making  Patient presents with a chief complaint of skin lesion.  Differential includes but is not limited to abscess, cellulitis, rash,  others  Patient's physical examination is consistent with an abscess.  Discussed incision and drainage versus antibiotic treatment with the patient.  Due to risk of scar formation patient elected to proceed at this time with antibiotic therapy.  Plan to try course of antibiotics.  Patient understands that if the area worsens or does not improve antibiotics that she may need an incision and drainage in the future.  Plan to discharge home at this time.  Return precautions provided.  Patient voices understanding with plan.        Final Clinical Impression(s) / ED Diagnoses Final diagnoses:  Abscess of chin    Rx / DC Orders ED Discharge Orders          Ordered    sulfamethoxazole-trimethoprim (BACTRIM DS) 800-160 MG tablet  2 times daily        05/06/23 2240              Pamala Duffel 05/06/23 2242    Jacalyn Lefevre, MD 05/06/23 2328

## 2023-05-06 NOTE — ED Triage Notes (Signed)
Patient with abscess to face on the chin area R side for 2 days. States it is draining clear fluids and itching. States this has never happened to her before.

## 2023-10-07 NOTE — L&D Delivery Note (Signed)
 Delivery Note  Melanie [name] is a [id number] at [redacted]w[redacted]d, Patient's last menstrual period was 10/21/2023., consistent with US  at [redacted]w[redacted]d. Estimated Date of Delivery: 07/27/24   Due to language barrier, an interpreter was present during the history-taking and subsequent discussion (and for part of the physical exam) with this patient.   First Stage: Labor onset: 0900 Augmentation: None Analgesia /Anesthesia intrapartum: IVPM SROM at 1121 GBS: positive IP Antibiotics: ampicillin  x 1  Second Stage: Complete dilation at 1230 Onset of pushing at 1225 FHR second stage 130 bpm with moderate variability, variable decels with pushing   Melanie [name] presented to L&D with active labor. She was expectantly managed. She progressed well to C/C/+2 with a strong urge to push after SROM.  She pushed quickly over approximately 10 minutes for a spontaneous vaginal birth.  Delivery of a viable baby girl on 07/21/2024 at 1237 by CNM Delivery of fetal head in OA position, no restitution. No nuchal cord;  Shoulders delivered in transverse position easily without assistance. Baby placed on mom's chest, and attended to by baby RN Cord double clamped after cessation of pulsation, cut by Melanie Igo, RN per patient's preference.   Cord blood sample collection: No A POS  Third Stage: Oxytocin  bolus started after delivery of infant for hemorrhage prophylaxis  Placenta delivered via Keren mechanism intact with 3 VC @ 1248 Placenta disposition: discarded  Uterine tone firm / bleeding small  Laceration: none Anesthesia for repair: N/A Suture: N/A Est. Blood Loss (mL): 75 ml   Complications: None  Mom to postpartum.  Baby to Couplet care / Skin to Skin.  Newborn: Information for the patient's newborn:  Melanie [name] [[id number]]  Live born female Melanie [name] Birth Weight:  Pending  APGAR: 8, 9  Newborn Delivery   Birth date/time: 07/21/2024 12:37:00 Delivery type: Vaginal, Spontaneous       Feeding planned: breast feeding  ---------- Melanie [name], CNM Certified Nurse Midwife [facility]

## 2023-10-23 ENCOUNTER — Other Ambulatory Visit (HOSPITAL_BASED_OUTPATIENT_CLINIC_OR_DEPARTMENT_OTHER): Payer: Self-pay | Admitting: Obstetrics & Gynecology

## 2023-10-23 DIAGNOSIS — Z30011 Encounter for initial prescription of contraceptive pills: Secondary | ICD-10-CM

## 2023-11-20 ENCOUNTER — Other Ambulatory Visit (HOSPITAL_BASED_OUTPATIENT_CLINIC_OR_DEPARTMENT_OTHER): Payer: Self-pay | Admitting: Obstetrics & Gynecology

## 2023-11-20 DIAGNOSIS — Z30011 Encounter for initial prescription of contraceptive pills: Secondary | ICD-10-CM

## 2024-01-11 ENCOUNTER — Ambulatory Visit (INDEPENDENT_AMBULATORY_CARE_PROVIDER_SITE_OTHER): Payer: Self-pay | Admitting: *Deleted

## 2024-01-11 ENCOUNTER — Ambulatory Visit (HOSPITAL_BASED_OUTPATIENT_CLINIC_OR_DEPARTMENT_OTHER): Payer: Self-pay | Admitting: Certified Nurse Midwife

## 2024-01-11 ENCOUNTER — Other Ambulatory Visit (HOSPITAL_COMMUNITY)
Admission: RE | Admit: 2024-01-11 | Discharge: 2024-01-11 | Disposition: A | Discharge status: Home / Self Care | Payer: Self-pay | Source: Ambulatory Visit | Attending: Certified Nurse Midwife | Admitting: Certified Nurse Midwife

## 2024-01-11 ENCOUNTER — Other Ambulatory Visit (HOSPITAL_BASED_OUTPATIENT_CLINIC_OR_DEPARTMENT_OTHER): Payer: Self-pay

## 2024-01-11 ENCOUNTER — Encounter (HOSPITAL_BASED_OUTPATIENT_CLINIC_OR_DEPARTMENT_OTHER): Payer: Self-pay | Admitting: Certified Nurse Midwife

## 2024-01-11 VITALS — BP 97/53 | HR 82 | Wt 110.8 lb

## 2024-01-11 DIAGNOSIS — Z3A11 11 weeks gestation of pregnancy: Secondary | ICD-10-CM

## 2024-01-11 DIAGNOSIS — Z3480 Encounter for supervision of other normal pregnancy, unspecified trimester: Secondary | ICD-10-CM | POA: Insufficient documentation

## 2024-01-11 DIAGNOSIS — Z3481 Encounter for supervision of other normal pregnancy, first trimester: Secondary | ICD-10-CM

## 2024-01-11 DIAGNOSIS — Z603 Acculturation difficulty: Secondary | ICD-10-CM

## 2024-01-11 DIAGNOSIS — O3680X0 Pregnancy with inconclusive fetal viability, not applicable or unspecified: Secondary | ICD-10-CM

## 2024-01-11 DIAGNOSIS — Z758 Other problems related to medical facilities and other health care: Secondary | ICD-10-CM

## 2024-01-11 DIAGNOSIS — Z789 Other specified health status: Secondary | ICD-10-CM

## 2024-01-11 DIAGNOSIS — Z3A12 12 weeks gestation of pregnancy: Secondary | ICD-10-CM

## 2024-01-11 NOTE — Assessment & Plan Note (Signed)
  NURSING  PROVIDER  Office Location {[facility] Locations:29385} Dating by {OB UEAVWU:98119}  PNC Model {PNC Model:29293} Anatomy U/S   Initiated care at  {Numbers; 0-100:15068}wks                Language  {Misc; languages:60526}              LAB RESULTS   Support Person  Genetics NIPS:  AFP:     NT/IT (FT only)     Carrier Screen Horizon:   Rhogam    A1C/GTT Early HgbA1C:  Third trimester 2 hr GTT:   Flu Vaccine     TDaP Vaccine   Blood Type    RSV Vaccine  Antibody    COVID Vaccine  Rubella    Feeding Plan {Breast milk Formula JYNW:29562} RPR    Contraception {PLAN CONTRACEPTION:313102} HBsAg    Circumcision  HIV    Pediatrician   HCVAb    Prenatal Classes     BTL Consent  Pap Diagnosis  Date Value Ref Range Status  08/07/2021   Final   - Negative for intraepithelial lesion or malignancy (NILM)    BTL Pre-payment  GC/CT Initial:   36wks:    VBAC Consent  GBS   For PCN allergy, check sensitivities   BRx Optimized? [ ]  yes   [x]  no    DME Rx [x]  BP cuff [ ]  Weight Scale Waterbirth  [ ]  Class [ ]  Consent [ ]  CNM visit  PHQ9 & GAD7 [x]  new OB [  ] 28 weeks  [  ] 36 weeks Induction  [ ]  Orders Entered [ ] Foley Y/N

## 2024-01-11 NOTE — Progress Notes (Unsigned)
 New OB Intake  I explained I am completing New OB Intake today. We discussed EDD of Not found.. Pt is [id number]. I reviewed her allergies, medications and Medical/Surgical/OB history.    Patient Active Problem List   Diagnosis Date Noted   Supervision of other normal pregnancy, antepartum 01/11/2024   Non-English speaking patient 11/20/2022    Concerns addressed today  Delivery Plans Plans to deliver at [facility]. Discussed the nature of our practice with multiple providers including residents and students. Due to the size of the practice, the delivering provider may not be the same as those providing prenatal care.   Patient is not interested in water birth. Offered upcoming OB visit with CNM to discuss further.  MyChart/Babyscripts MyChart access verified. I explained pt will have some visits in office and some virtually. Babyscripts instructions given and order placed. Patient verifies receipt of registration text/e-mail. Account successfully created and app downloaded. If patient is a candidate for Optimized scheduling, add to sticky note.   Blood Pressure Cuff/Weight Scale {blood pressure cuff:24241} Explained after first prenatal appt pt will check weekly and document in Babyscripts. Patient {weight scale:28336}.  Anatomy US Explained first scheduled [place] will be around 19 weeks. Anatomy US scheduled for *** at ***.  Is patient a CenteringPregnancy candidate?  Not a Candidate Declined due to  Not a candidate due to Language barrier If accepted,    Is patient a Mom+Baby Combined Care candidate?  Not a candidate   If accepted, confirm patient does not intend to move from the area for at least 12 months, then notify Mom+Baby staff  Interested in [place]? If yes, send referral and doula dot phrase.   Is patient a candidate for Babyscripts Optimization? No, due to language barrier   First visit review I reviewed new OB appt with patient. Explained pt will be seen by *** at first  visit. Discussed Avelina Laine genetic screening with patient. *** Panorama and Horizon.. Routine prenatal labs {collected today/needed at new OB visit:9024}   Last Pap Diagnosis  Date Value Ref Range Status  08/07/2021   Final   - Negative for intraepithelial lesion or malignancy (NILM)    Harrie Jeans, RN 01/11/2024  3:22 PM

## 2024-01-11 NOTE — Progress Notes (Signed)
 INITIAL PRENATAL VISIT  Subjective:   Melanie [name] is being seen today for her first obstetrical visit.  This is not a planned pregnancy. This is a desired pregnancy.  She is at [redacted]w[redacted]d gestation by LMP confirmed by [place] today. Limited transabdominal US today confirms single viable IUP measuring [redacted]w[redacted]d CRL and [redacted]w[redacted]d CRL with FHR 158bpm. Active    Her obstetrical history is significant for  none . Relationship with FOB: spouse, living together. Patient does intend to breast feed. Pregnancy history fully reviewed.  Patient reports no bleeding.   Review of Systems:   Review of Systems  Objective:    Obstetric History OB History  Gravida Para Term Preterm AB Living  4 3 3   3   SAB IAB Ectopic Multiple Live Births     0 3    # Outcome Date GA Lbr Len/2nd Weight Sex Type Anes PTL Lv  4 Current           3 Term 01/24/22 [redacted]w[redacted]d  5 lb 15.2 oz (2.7 kg) F Vag-Spont None  LIV     Birth Comments: none   2 Term 07/22/17    F Vag-Spont     1 Term 07/22/15    Heide Scales     Past Medical History:  Diagnosis Date   History of anemia 11/23/2021   Medical history non-contributory     Past Surgical History:  Procedure Laterality Date   NO PAST SURGERIES      Current Outpatient Medications on File Prior to Visit  Medication Sig Dispense Refill   Prenatal Vit-Fe Fumarate-FA (PRENATAL COMPLETE) 14-0.4 MG TABS Take 1 tablet by mouth daily. (Patient not taking: Reported on 01/11/2024) 90 tablet 0   No current facility-administered medications on file prior to visit.    No Known Allergies  Social History:  reports that she has never smoked. She has never used smokeless tobacco. She reports that she does not drink alcohol and does not use drugs.  Family History  Problem Relation Age of Onset   Liver disease Mother    Hypertension Father     The following portions of the patient's history were reviewed and updated as appropriate: allergies, current medications, past  family history, past medical history, past social history, past surgical history and problem list.  Review of Systems Review of Systems  Constitutional:  Positive for fatigue.  HENT:  Positive for congestion.   Eyes: Negative.   Respiratory: Negative.    Cardiovascular: Negative.   Gastrointestinal:  Positive for constipation.  Endocrine: Negative.   Genitourinary:  Negative for difficulty urinating, hematuria, pelvic pain and urgency.  Allergic/Immunologic: Negative.   Neurological: Negative.   Hematological: Negative.   Psychiatric/Behavioral: Negative.    All other systems reviewed and are negative.     Physical Exam:  BP (!) 97/53   Pulse 82   Wt 110 lb 12.8 oz (50.3 kg)   LMP 10/21/2023   BMI 20.94 kg/m  CONSTITUTIONAL: Well-developed, well-nourished female in no acute distress.  HENT:  Normocephalic, atraumatic.  Oropharynx is clear and moist EYES: Conjunctivae normal. No scleral icterus.  NECK: Normal range of motion, supple, no masses.  Normal thyroid.  SKIN: Skin is warm and dry. No rash noted. Not diaphoretic. No erythema. No pallor. MUSCULOSKELETAL: Normal range of motion. No tenderness.  No cyanosis, clubbing, or edema.   NEUROLOGIC: Alert and oriented to person, place, and time. Normal muscle tone coordination.  PSYCHIATRIC: Normal mood and affect. Normal  behavior. Normal judgment and thought content.  Fetal Heart Rate (bpm): 156 (on u/s)   Movement: Absent       Assessment:    Pregnancy: G4P3003  1. Supervision of other normal pregnancy, antepartum (Primary) - Start prenatal vitamins (OTC) - Recommended Colace for stool softener - Pepcid or Tums for indigestion   Plan:   Initial labs drawn. Prenatal vitamins. Problem list reviewed and updated. Reviewed in detail the nature of the practice with collaborative care between  Genetic screening discussed: NIPS/First trimester screen/Quad/AFP requested. Role of ultrasound in pregnancy discussed; Anatomy  US: requested. Amniocentesis discussed: not indicated. Follow up in 4 weeks. Weight gain recommendations per IOM guidelines reviewed: underweight/BMI 18.5 or less > 28 - 40 lbs; normal weight/BMI 18.5 - 24.9 > 25 - 35 lbs; overweight/BMI 25 - 29.9 > 15 - 25 lbs; obese/BMI  30 or more > 11 - 20 lbs.  Discussed clinic routines, schedule of care and testing, genetic screening options, involvement of students and residents under the direct supervision of APPs and doctors and presence of female providers. Pt verbalized understanding.   Letta Kocher, CNM 01/11/2024 4:22 PM

## 2024-01-12 ENCOUNTER — Other Ambulatory Visit (HOSPITAL_BASED_OUTPATIENT_CLINIC_OR_DEPARTMENT_OTHER): Payer: Self-pay | Admitting: Certified Nurse Midwife

## 2024-01-12 DIAGNOSIS — O99011 Anemia complicating pregnancy, first trimester: Secondary | ICD-10-CM

## 2024-01-12 DIAGNOSIS — Z3480 Encounter for supervision of other normal pregnancy, unspecified trimester: Secondary | ICD-10-CM

## 2024-01-12 DIAGNOSIS — O99019 Anemia complicating pregnancy, unspecified trimester: Secondary | ICD-10-CM | POA: Insufficient documentation

## 2024-01-12 LAB — CBC
Hematocrit: 33.8 % — ABNORMAL LOW (ref 34.0–46.6)
Hemoglobin: 10.7 g/dL — ABNORMAL LOW (ref 11.1–15.9)
MCH: 26.8 pg (ref 26.6–33.0)
MCHC: 31.7 g/dL (ref 31.5–35.7)
MCV: 85 fL (ref 79–97)
Platelets: 235 10*3/uL (ref 150–450)
RBC: 4 x10E6/uL (ref 3.77–5.28)
RDW: 13 % (ref 11.7–15.4)
WBC: 5.3 10*3/uL (ref 3.4–10.8)

## 2024-01-12 LAB — HEPATITIS C ANTIBODY: Hep C Virus Ab: NONREACTIVE

## 2024-01-12 LAB — HEMOGLOBIN A1C
Est. average glucose Bld gHb Est-mCnc: 105 mg/dL
Hgb A1c MFr Bld: 5.3 % (ref 4.8–5.6)

## 2024-01-12 LAB — CERVICOVAGINAL ANCILLARY ONLY
Chlamydia: NEGATIVE
Comment: NEGATIVE
Comment: NORMAL
Neisseria Gonorrhea: NEGATIVE

## 2024-01-12 LAB — ABO/RH: Rh Factor: POSITIVE

## 2024-01-12 LAB — HEPATITIS B SURFACE ANTIGEN: Hepatitis B Surface Ag: NEGATIVE

## 2024-01-12 LAB — RPR: RPR Ser Ql: NONREACTIVE

## 2024-01-12 LAB — HIV ANTIBODY (ROUTINE TESTING W REFLEX): HIV Screen 4th Generation wRfx: NONREACTIVE

## 2024-01-12 LAB — ANTIBODY SCREEN: Antibody Screen: NEGATIVE

## 2024-01-12 LAB — RUBELLA SCREEN: Rubella Antibodies, IGG: 2.16 {index} (ref >0.99)

## 2024-01-13 ENCOUNTER — Other Ambulatory Visit (HOSPITAL_BASED_OUTPATIENT_CLINIC_OR_DEPARTMENT_OTHER): Payer: Self-pay | Admitting: Certified Nurse Midwife

## 2024-01-13 DIAGNOSIS — R8271 Bacteriuria: Secondary | ICD-10-CM

## 2024-01-13 DIAGNOSIS — Z3480 Encounter for supervision of other normal pregnancy, unspecified trimester: Secondary | ICD-10-CM

## 2024-01-13 LAB — URINE CULTURE

## 2024-01-13 MED ORDER — CEPHALEXIN 250 MG PO CAPS: 250.0000 mg | ORAL_CAPSULE | Freq: Four times a day (QID) | ORAL | 0 refills | Status: AC

## 2024-01-16 LAB — PANORAMA PRENATAL TEST FULL PANEL:PANORAMA TEST PLUS 5 ADDITIONAL MICRODELETIONS: FETAL FRACTION: 8.4

## 2024-02-03 ENCOUNTER — Inpatient Hospital Stay (HOSPITAL_COMMUNITY)

## 2024-02-03 ENCOUNTER — Encounter (HOSPITAL_COMMUNITY): Payer: Self-pay | Admitting: Obstetrics & Gynecology

## 2024-02-03 ENCOUNTER — Inpatient Hospital Stay (HOSPITAL_COMMUNITY)
Admission: AD | Admit: 2024-02-03 | Discharge: 2024-02-03 | Disposition: A | Discharge status: Home / Self Care | Attending: Obstetrics & Gynecology | Admitting: Obstetrics & Gynecology

## 2024-02-03 DIAGNOSIS — R1084 Generalized abdominal pain: Secondary | ICD-10-CM | POA: Diagnosis not present

## 2024-02-03 DIAGNOSIS — R109 Unspecified abdominal pain: Secondary | ICD-10-CM | POA: Diagnosis not present

## 2024-02-03 DIAGNOSIS — Z603 Acculturation difficulty: Secondary | ICD-10-CM | POA: Diagnosis not present

## 2024-02-03 DIAGNOSIS — R1030 Lower abdominal pain, unspecified: Secondary | ICD-10-CM | POA: Diagnosis present

## 2024-02-03 DIAGNOSIS — O26892 Other specified pregnancy related conditions, second trimester: Secondary | ICD-10-CM

## 2024-02-03 DIAGNOSIS — Z3A15 15 weeks gestation of pregnancy: Secondary | ICD-10-CM | POA: Insufficient documentation

## 2024-02-03 DIAGNOSIS — L292 Pruritus vulvae: Secondary | ICD-10-CM | POA: Diagnosis not present

## 2024-02-03 DIAGNOSIS — Z758 Other problems related to medical facilities and other health care: Secondary | ICD-10-CM

## 2024-02-03 DIAGNOSIS — N898 Other specified noninflammatory disorders of vagina: Secondary | ICD-10-CM

## 2024-02-03 LAB — URINALYSIS, ROUTINE W REFLEX MICROSCOPIC
Bilirubin Urine: NEGATIVE
Glucose, UA: NEGATIVE mg/dL
Ketones, ur: NEGATIVE mg/dL
Nitrite: NEGATIVE
Protein, ur: NEGATIVE mg/dL
Specific Gravity, Urine: 1.017 (ref 1.005–1.030)
pH: 6 (ref 5.0–8.0)

## 2024-02-03 LAB — WET PREP, GENITAL
Clue Cells Wet Prep HPF POC: NONE SEEN
Sperm: NONE SEEN
Trich, Wet Prep: NONE SEEN
WBC, Wet Prep HPF POC: >=10 — AB (ref <10)
Yeast Wet Prep HPF POC: NONE SEEN

## 2024-02-03 MED ORDER — FLUCONAZOLE 150 MG PO TABS: 150.0000 mg | ORAL_TABLET | Freq: Once | ORAL | 0 refills | Status: AC

## 2024-02-03 MED ORDER — IBUPROFEN 600 MG PO TABS: 600.0000 mg | ORAL_TABLET | Freq: Once | ORAL | Status: DC

## 2024-02-03 NOTE — Discharge Instructions (Signed)
Take Tylenol 1000 mg by mouth every 8 hours as needed for pain

## 2024-02-03 NOTE — MAU Provider Note (Addendum)
 History     CSN: [id number]  Arrival date and time: 02/03/24 4098   Event Date/Time   First Provider Initiated Contact with Patient 02/03/24 2053  **[employer] Video Arabic Interpreter, Ruthy Cox [phone] used for assessment, history taking, review of results, discuss options for pain management, d/c instructions and follow-up.**       Chief Complaint  Patient presents with   Abdominal Pain   Pelvic Pain   HPI Ms. Melanie [name] is a 28 y.o. year old G41P3003 female at [redacted]w[redacted]d weeks gestation who presents to MAU reporting lower abdominal and pelvic pain since yesterday. She has not taken any medication; 7/10. She describes the pain as intermittent cramping; "like a period." She also reports vaginal itching after completing the abx for her recent UTI; finished 4 days ago. She denies VB. She receives [facility]; next appt is 02/09/2024. Her spouse, Adel, is present and contributing to the history taking.   OB History     Gravida  4   Para  3   Term  3   Preterm      AB      Living  3      SAB      IAB      Ectopic      Multiple  0   Live Births  3           Past Medical History:  Diagnosis Date   History of anemia 11/23/2021   Medical history non-contributory     Past Surgical History:  Procedure Laterality Date   NO PAST SURGERIES      Family History  Problem Relation Age of Onset   Liver disease Mother    Hypertension Father     Social History   Tobacco Use   Smoking status: Never   Smokeless tobacco: Never  Vaping Use   Vaping status: Never Used  Substance Use Topics   Alcohol use: Never   Drug use: Never    Allergies: No Known Allergies  Medications Prior to Admission  Medication Sig Dispense Refill Last Dose/Taking   Prenatal Vit-Fe Fumarate-FA (PRENATAL COMPLETE) 14-0.4 MG TABS Take 1 tablet by mouth daily. 90 tablet 0 02/03/2024    Review of Systems  Constitutional: Negative.   HENT: Negative.     Eyes: Negative.   Respiratory: Negative.    Cardiovascular: Negative.   Gastrointestinal: Negative.   Endocrine: Negative.   Genitourinary:  Positive for pelvic pain.  Musculoskeletal: Negative.   Skin: Negative.   Allergic/Immunologic: Negative.   Neurological: Negative.   Hematological: Negative.   Psychiatric/Behavioral: Negative.     Physical Exam   Blood pressure (!) 99/57, pulse 82, temperature 98.3 F (36.8 C), temperature source Oral, resp. rate 16, height 5\' 2"  (1.575 m), weight 50.7 kg, last menstrual period 10/21/2023, SpO2 100%, currently breastfeeding.  Physical Exam Vitals and nursing note reviewed. Exam conducted with a chaperone present.  Constitutional:      Appearance: Normal appearance. She is normal weight.  Cardiovascular:     Rate and Rhythm: Normal rate.  Pulmonary:     Effort: Pulmonary effort is normal.  Abdominal:     Palpations: Abdomen is soft.  Genitourinary:    Comments: Swabs collected by patient using blind swab technique  Musculoskeletal:        General: Normal range of motion.  Skin:    General: Skin is warm and dry.  Neurological:     Mental Status: She is alert  and oriented to person, place, and time.  Psychiatric:        Mood and Affect: Mood normal.        Behavior: Behavior normal.        Thought Content: Thought content normal.        Judgment: Judgment normal.    FHTs by doppler: 150  bpm  MAU Course  Procedures  MDM CCUA Wet Prep GC/CT -- Results pending  OB MFM Limited U/S Ibuprofen  600 mg  [employer] Interpreter Service with each interaction  Results for orders placed or performed during the hospital encounter of 02/03/24 (from the past 24 hours)  Urinalysis, Routine w reflex microscopic -Urine, Clean Catch     Status: Abnormal   Collection Time: 02/03/24  7:14 PM  Result Value Ref Range   Color, Urine YELLOW YELLOW   APPearance HAZY (A) CLEAR   Specific Gravity, Urine 1.017 1.005 - 1.030   pH 6.0 5.0 - 8.0    Glucose, UA NEGATIVE NEGATIVE mg/dL   Hgb urine dipstick SMALL (A) NEGATIVE   Bilirubin Urine NEGATIVE NEGATIVE   Ketones, ur NEGATIVE NEGATIVE mg/dL   Protein, ur NEGATIVE NEGATIVE mg/dL   Nitrite NEGATIVE NEGATIVE   Leukocytes,Ua TRACE (A) NEGATIVE   RBC / HPF 0-5 0 - 5 RBC/hpf   WBC, UA 0-5 0 - 5 WBC/hpf   Bacteria, UA RARE (A) NONE SEEN   Squamous Epithelial / HPF 6-10 0 - 5 /HPF   Mucus PRESENT   Wet prep, genital     Status: Abnormal   Collection Time: 02/03/24  8:42 PM  Result Value Ref Range   Yeast Wet Prep HPF POC NONE SEEN NONE SEEN   Trich, Wet Prep NONE SEEN NONE SEEN   Clue Cells Wet Prep HPF POC NONE SEEN NONE SEEN   WBC, Wet Prep HPF POC >=10 (A) <10   Sperm NONE SEEN     Assessment and Plan  1. Abdominal pain during pregnancy in second trimester (Primary) - Information provided on abdominal pain in pregnancy - Advised to take Tylenol  1000 mg every 8 hrs prn pain  2. Language barrier affecting health care - [employer] Video Arabic Interpreter, Ruthy Cox [phone] used for assessment, history taking, review of results, discuss options for pain management, d/c instructions and follow-up.     3. Vaginal itching - Prescription for: Diflucan 150 mg po x 1 dose  4. [redacted] weeks gestation of pregnancy   - Discharge home - Keep scheduled appt with [facility] on 02/09/2024 - Patient verbalized an understanding of the plan of care and agrees.   Almond Army, CNM 02/03/2024, 8:53 PM

## 2024-02-03 NOTE — MAU Note (Addendum)
..  Melanie [name] is a 28 y.o. at [redacted]w[redacted]d here in MAU reporting: Lower abdominal pain and pelvic pain since yesterday, has not taken anything for the pain. The pain comes and goes like cramping.  Reports that she has vaginal itching that began after she started using medication for UTI. Finished the medication 4 days ago.  Denies vaginal bleeding.   Pain score: abdominal 7/10; pelvic pain 7/10 Vitals:   02/03/24 1936  BP: (!) 99/57  Pulse: 82  Resp: 16  Temp: 98.3 F (36.8 C)  SpO2: 100%     FHT:150 Lab orders placed from triage:  UA

## 2024-02-04 LAB — GC/CHLAMYDIA PROBE AMP (~~LOC~~) NOT AT ARMC
Chlamydia: NEGATIVE
Comment: NEGATIVE
Comment: NORMAL
Neisseria Gonorrhea: NEGATIVE

## 2024-02-09 ENCOUNTER — Ambulatory Visit (INDEPENDENT_AMBULATORY_CARE_PROVIDER_SITE_OTHER): Payer: Self-pay | Admitting: Certified Nurse Midwife

## 2024-02-09 VITALS — BP 98/64 | HR 87 | Wt 114.4 lb

## 2024-02-09 DIAGNOSIS — R8271 Bacteriuria: Secondary | ICD-10-CM | POA: Diagnosis not present

## 2024-02-09 DIAGNOSIS — O99012 Anemia complicating pregnancy, second trimester: Secondary | ICD-10-CM | POA: Diagnosis not present

## 2024-02-09 DIAGNOSIS — Z3A16 16 weeks gestation of pregnancy: Secondary | ICD-10-CM | POA: Diagnosis not present

## 2024-02-09 DIAGNOSIS — Z758 Other problems related to medical facilities and other health care: Secondary | ICD-10-CM

## 2024-02-09 DIAGNOSIS — Z3480 Encounter for supervision of other normal pregnancy, unspecified trimester: Secondary | ICD-10-CM

## 2024-02-09 DIAGNOSIS — Z603 Acculturation difficulty: Secondary | ICD-10-CM

## 2024-02-09 MED ORDER — FLUCONAZOLE 150 MG PO TABS: 150.0000 mg | ORAL_TABLET | Freq: Once | ORAL | 0 refills | Status: AC

## 2024-02-10 NOTE — Progress Notes (Signed)
   PRENATAL VISIT NOTE  Subjective:  Melanie [name] is a 28 y.o. [phone] at [redacted]w[redacted]d being seen today for ongoing prenatal care.  She is currently monitored for the following issues for this  pregnancy and has GBS bacteriuria; Language barrier; Supervision of other normal pregnancy, antepartum; and Anemia of mother in pregnancy on their problem list.  Patient reports no bleeding, no contractions, no cramping, and no leaking.  Contractions: Not present. Vag. Bleeding: None.  Movement: Absent. Denies leaking of fluid.   The following portions of the patient's history were reviewed and updated as appropriate: allergies, current medications, past family history, past medical history, past social history, past surgical history and problem list.   Objective:   Vitals:   02/09/24 1613  BP: 98/64  Pulse: 87  Weight: 114 lb 6.4 oz (51.9 kg)    Fetal Status: Fetal Heart Rate (bpm): 150   Movement: Absent     General:  Alert, oriented and cooperative. Patient is in no acute distress.  Skin: Skin is warm and dry. No rash noted.   Cardiovascular: Normal heart rate noted  Respiratory: Normal respiratory effort, no problems with respiration noted  Abdomen: Soft, gravid, appropriate for gestational age.  Pain/Pressure: Present     Pelvic: Cervical exam deferred        Extremities: Normal range of motion.  Edema: None  Mental Status: Normal mood and affect. Normal behavior. Normal judgment and thought content.   Assessment and Plan:  Pregnancy: [id number] at [redacted]w[redacted]d - Panorama Normal, female - Pt taking prenatal vitamin - Declines AFP today (her 33 year old daughter is in the ED r/t a fall and pt needs to get back to hospital to be with daughter). - Anatomy US  will be scheduled. - Visit completed with Interpreter -RTO 4 weeks for ROB (Offer AFP at that appt). Melanie [name]  Preterm labor symptoms and general obstetric precautions including but not limited to vaginal bleeding, contractions,  leaking of fluid and fetal movement were reviewed in detail with the patient. Please refer to After Visit Summary for other counseling recommendations.   No follow-ups on file.  Future Appointments  Date Time Provider Department Center  03/08/2024  3:35 PM Narvel Kozub, Melanie Opoka, CNM [facility]    Yolanda Hence, CNM

## 2024-03-08 ENCOUNTER — Ambulatory Visit (INDEPENDENT_AMBULATORY_CARE_PROVIDER_SITE_OTHER): Payer: Self-pay | Admitting: Certified Nurse Midwife

## 2024-03-08 VITALS — BP 98/58 | HR 88 | Wt 116.8 lb

## 2024-03-08 DIAGNOSIS — O26892 Other specified pregnancy related conditions, second trimester: Secondary | ICD-10-CM | POA: Diagnosis not present

## 2024-03-08 DIAGNOSIS — Z3480 Encounter for supervision of other normal pregnancy, unspecified trimester: Secondary | ICD-10-CM

## 2024-03-08 DIAGNOSIS — Z3A20 20 weeks gestation of pregnancy: Secondary | ICD-10-CM

## 2024-03-08 DIAGNOSIS — M549 Dorsalgia, unspecified: Secondary | ICD-10-CM

## 2024-03-08 LAB — POCT URINALYSIS DIPSTICK
Bilirubin, UA: NEGATIVE
Glucose, UA: NEGATIVE
Ketones, UA: NEGATIVE
Leukocytes, UA: NEGATIVE
Nitrite, UA: NEGATIVE
Protein, UA: NEGATIVE
Spec Grav, UA: 1.02 (ref 1.010–1.025)
Urobilinogen, UA: 0.2 U/dL
pH, UA: 6.5 (ref 5.0–8.0)

## 2024-03-08 NOTE — Progress Notes (Unsigned)
 y

## 2024-03-09 LAB — URINE CULTURE

## 2024-03-10 ENCOUNTER — Ambulatory Visit (HOSPITAL_BASED_OUTPATIENT_CLINIC_OR_DEPARTMENT_OTHER): Payer: Self-pay | Admitting: Certified Nurse Midwife

## 2024-03-10 LAB — AFP, SERUM, OPEN SPINA BIFIDA
AFP MoM: 1.16
AFP Value: 78.4 ng/mL
Gest. Age on Collection Date: 20 wk
Maternal Age At EDD: 30.1 a
OSBR Risk 1 IN: 7366
Test Results:: NEGATIVE
Weight: 116 [lb_av]

## 2024-03-11 NOTE — Progress Notes (Signed)
    PRENATAL VISIT NOTE  Subjective:  Melanie [name] is a 28 y.o. [phone] at [redacted]w[redacted]d being seen today for ongoing prenatal care.  She is currently monitored for the following issues for this  pregnancy and has GBS bacteriuria; Language barrier; Supervision of other normal pregnancy, antepartum; and Anemia of mother in pregnancy on their problem list.  Patient reports no bleeding, no contractions, no cramping, and no leaking.  Contractions: Not present. Vag. Bleeding: None.  Movement: Present. Denies leaking of fluid.   The following portions of the patient's history were reviewed and updated as appropriate: allergies, current medications, past family history, past medical history, past social history, past surgical history and problem list.   Objective:    Vitals:   03/08/24 1550  BP: (!) 98/58  Pulse: 88  Weight: 116 lb 12.8 oz (53 kg)    Fetal Status:  Fetal Heart Rate (bpm): 140   Movement: Present    General: Alert, oriented and cooperative. Patient is in no acute distress.  Skin: Skin is warm and dry. No rash noted.   Cardiovascular: Normal heart rate noted  Respiratory: Normal respiratory effort, no problems with respiration noted  Abdomen: Soft, gravid, appropriate for gestational age.  Pain/Pressure: Present     Pelvic: Cervical exam deferred        Extremities: Normal range of motion.  Edema: None  Mental Status: Normal mood and affect. Normal behavior. Normal judgment and thought content.   Assessment and Plan:  Pregnancy: G4P3003 at [redacted]w[redacted]d 1. Back pain, unspecified back location, unspecified back pain laterality, unspecified chronicity (Primary) - Urine Culture - POCT Urinalysis Dipstick  2. Supervision of other normal pregnancy, antepartum - AFP, Serum, Open Spina Bifida  3. [redacted] weeks gestation of pregnancy   Preterm labor symptoms and general obstetric precautions including but not limited to vaginal bleeding, contractions, leaking of fluid and fetal  movement were reviewed in detail with the patient. Please refer to After Visit Summary for other counseling recommendations.   No follow-ups on file.  Future Appointments  Date Time Provider Department Center  03/18/2024  8:00 AM [facility] PROVIDER 1 [facility]  03/18/2024  8:30 AM [facility] US3 [facility]  04/06/2024  3:55 PM Lillian Rein, MD [facility]  04/27/2024  8:55 AM Lillian Rein, MD [facility]  05/17/2024  3:55 PM Mailani Degroote, Juvenal Opoka, CNM [facility]  05/31/2024  3:55 PM Lucan Riner, Juvenal Opoka, CNM [facility]  06/14/2024  3:55 PM Lillian Rein, MD [facility]  06/30/2024  3:55 PM Lillian Rein, MD [facility]  07/07/2024  3:35 PM Lillian Rein, MD [facility]  07/14/2024  3:55 PM Lillian Rein, MD [facility]  07/20/2024  3:55 PM Lillian Rein, MD [facility]  07/27/2024  3:55 PM Lillian Rein, MD [facility]    Yolanda Hence, CNM

## 2024-03-18 ENCOUNTER — Ambulatory Visit (HOSPITAL_BASED_OUTPATIENT_CLINIC_OR_DEPARTMENT_OTHER): Admitting: Maternal & Fetal Medicine

## 2024-03-18 ENCOUNTER — Ambulatory Visit: Attending: Certified Nurse Midwife

## 2024-03-18 VITALS — BP 84/54 | HR 89

## 2024-03-18 DIAGNOSIS — Z3480 Encounter for supervision of other normal pregnancy, unspecified trimester: Secondary | ICD-10-CM | POA: Diagnosis not present

## 2024-03-18 DIAGNOSIS — Z3A21 21 weeks gestation of pregnancy: Secondary | ICD-10-CM

## 2024-03-18 DIAGNOSIS — Z3481 Encounter for supervision of other normal pregnancy, first trimester: Secondary | ICD-10-CM | POA: Diagnosis not present

## 2024-03-18 DIAGNOSIS — D649 Anemia, unspecified: Secondary | ICD-10-CM | POA: Diagnosis not present

## 2024-03-18 DIAGNOSIS — Z3689 Encounter for other specified antenatal screening: Secondary | ICD-10-CM | POA: Diagnosis not present

## 2024-03-18 DIAGNOSIS — O99012 Anemia complicating pregnancy, second trimester: Secondary | ICD-10-CM | POA: Diagnosis not present

## 2024-03-18 NOTE — Progress Notes (Signed)
 Patient information  Patient Name: Melanie [name]  Patient MRN:   [id number]  Referring practice: MFM Referring Provider: [facility]  Problem List   Patient Active Problem List   Diagnosis Date Noted   Anemia of mother in pregnancy 01/12/2024   Supervision of other normal pregnancy, antepartum 01/11/2024   Language barrier 11/15/2021   GBS bacteriuria 07/28/2021   Maternal Fetal Medicine Consult [facility] [name] is a 28 y.o. G4P3003 at [redacted]w[redacted]d here for ultrasound and consultation. She had Low risk of a female fetus. Carrier screening was N/A. Maternal serum AFPwas negative. She has no acute concerns. Today we focused on the following:   Anemia:   The patient has a history of anemia.  The patient is not aware of what type of anemia she has.  Based on her Red cell indices it is possible she has iron deficiency anemia but may have some form of hemoglobinopathy and this should be assessed.  Sonographic findings Single intrauterine pregnancy at 21w 2d. Fetal cardiac activity:  Observed and appears normal. Presentation: Cephalic. The anatomic structures that were well seen appear normal without evidence of soft markers. The anatomic survey is complete.  Fetal biometry shows the estimated fetal weight at the 77 percentile. Amniotic fluid: Within normal limits.  MVP: 5.78 cm. Placenta: Anterior. Adnexa: No abnormality visualized. Cervical length: 3.6 cm.  There are limitations of prenatal ultrasound such as the inability to detect certain abnormalities due to poor visualization. Various factors such as fetal position, gestational age and maternal body habitus may increase the difficulty in visualizing the fetal anatomy.    Recommendations - EDD should be 07/27/2024 based on  LMP  (10/21/23). - Anatomy ultrasound was done today with the above findings (see report). - Ferritin level and hemoglobin electrophoresis  - No further ultrasounds are  recommended at this time based on the current indications. If future indications arise (e.g. size/date discrepancy on fundal height, gestational diabetes or hypertension) and an ultrasound is to be desired at our MFM office, please send a referral.   Review of Systems: A review of systems was performed and was negative except per HPI   Past Obstetrical History:  OB History  Gravida Para Term Preterm AB Living  4 3 3   3   SAB IAB Ectopic Multiple Live Births     0 3    # Outcome Date GA Lbr Len/2nd Weight Sex Type Anes PTL Lv  4 Current           3 Term 01/24/22 [redacted]w[redacted]d  5 lb 15.2 oz (2.7 kg) F Vag-Spont None  LIV     Birth Comments: none   2 Term 07/22/17    F Vag-Spont     1 Term 07/22/15    Caldwell Castles      Past Medical History:  Past Medical History:  Diagnosis Date   History of anemia 11/23/2021   Medical history non-contributory      Past Surgical History:    Past Surgical History:  Procedure Laterality Date   NO PAST SURGERIES       Home Medications:   Current Outpatient Medications on File Prior to Visit  Medication Sig Dispense Refill   Prenatal Vit-Fe Fumarate-FA (PRENATAL COMPLETE) 14-0.4 MG TABS Take 1 tablet by mouth daily. 90 tablet 0   No current facility-administered medications on file prior to visit.      Allergies:   No Known Allergies   Physical  Exam:   Vitals:   03/18/24 0828  BP: (!) 84/54  Pulse: 89   Sitting comfortably on the sonogram table Nonlabored breathing Normal rate and rhythm Abdomen is nontender  Thank you for the opportunity to be involved with this patient's care. Please let us  know if we can be of any further assistance.   30 minutes of time was spent reviewing the patient's chart including labs, imaging and documentation.  At least 50% of this time was spent with direct patient care discussing the diagnosis, management and prognosis of her care.  Grant Lay MFM, [place]   03/18/2024  9:24 AM

## 2024-04-06 ENCOUNTER — Encounter (HOSPITAL_BASED_OUTPATIENT_CLINIC_OR_DEPARTMENT_OTHER): Payer: Self-pay | Admitting: Obstetrics & Gynecology

## 2024-04-27 ENCOUNTER — Encounter (HOSPITAL_BASED_OUTPATIENT_CLINIC_OR_DEPARTMENT_OTHER): Payer: Self-pay | Admitting: Obstetrics & Gynecology

## 2024-04-27 ENCOUNTER — Other Ambulatory Visit (HOSPITAL_BASED_OUTPATIENT_CLINIC_OR_DEPARTMENT_OTHER)

## 2024-04-27 ENCOUNTER — Ambulatory Visit (INDEPENDENT_AMBULATORY_CARE_PROVIDER_SITE_OTHER): Payer: Self-pay | Admitting: Obstetrics & Gynecology

## 2024-04-27 ENCOUNTER — Other Ambulatory Visit (HOSPITAL_BASED_OUTPATIENT_CLINIC_OR_DEPARTMENT_OTHER): Payer: Self-pay

## 2024-04-27 VITALS — BP 92/60 | HR 79 | Wt 128.6 lb

## 2024-04-27 DIAGNOSIS — Z23 Encounter for immunization: Secondary | ICD-10-CM

## 2024-04-27 DIAGNOSIS — D508 Other iron deficiency anemias: Secondary | ICD-10-CM

## 2024-04-27 DIAGNOSIS — Z3480 Encounter for supervision of other normal pregnancy, unspecified trimester: Secondary | ICD-10-CM

## 2024-04-27 DIAGNOSIS — Z3A27 27 weeks gestation of pregnancy: Secondary | ICD-10-CM

## 2024-04-27 DIAGNOSIS — Z789 Other specified health status: Secondary | ICD-10-CM

## 2024-04-27 DIAGNOSIS — Z3482 Encounter for supervision of other normal pregnancy, second trimester: Secondary | ICD-10-CM

## 2024-04-27 MED ORDER — TRIAMCINOLONE ACETONIDE 0.1 % EX CREA
1.0000 | TOPICAL_CREAM | Freq: Two times a day (BID) | CUTANEOUS | 0 refills | Status: DC
  Filled 2024-04-27: qty 30, 30d supply, fill #0

## 2024-04-28 ENCOUNTER — Other Ambulatory Visit (HOSPITAL_BASED_OUTPATIENT_CLINIC_OR_DEPARTMENT_OTHER): Payer: Self-pay

## 2024-04-30 DIAGNOSIS — Z862 Personal history of diseases of the blood and blood-forming organs and certain disorders involving the immune mechanism: Secondary | ICD-10-CM | POA: Insufficient documentation

## 2024-04-30 DIAGNOSIS — D649 Anemia, unspecified: Secondary | ICD-10-CM | POA: Insufficient documentation

## 2024-04-30 NOTE — Progress Notes (Addendum)
   PRENATAL VISIT NOTE  Subjective:  Melanie [name] is a 28 y.o. [phone] at [redacted]w[redacted]d being seen today for ongoing prenatal care.  She is currently monitored for the following issues for this low-risk pregnancy and has GBS bacteriuria; Language barrier; Supervision of other normal pregnancy, antepartum; Anemia of mother in pregnancy; and Absolute anemia on their problem list.  Patient reports no complaints.  Contractions: Not present. Vag. Bleeding: None.  Movement: Present. Denies leaking of fluid.   Pt did eat today so cannot do GTT.  Says she can come tomorrow.  Placed on lab schedule.    The following portions of the patient's history were reviewed and updated as appropriate: allergies, current medications, past family history, past medical history, past social history, past surgical history and problem list.   Objective:    Vitals:   04/27/24 0921  BP: 92/60  Pulse: 79  Weight: 128 lb 9.6 oz (58.3 kg)    Fetal Status:  Fetal Heart Rate (bpm): 148 (Simultaneous filing. User may not have seen previous data.) Fundal Height: 27 cm Movement: Present    General: Alert, oriented and cooperative. Patient is in no acute distress.  Skin: Skin is warm and dry. No rash noted.   Cardiovascular: Normal heart rate noted  Respiratory: Normal respiratory effort, no problems with respiration noted  Abdomen: Soft, gravid, appropriate for gestational age.  Pain/Pressure: Present (Pressure)     Pelvic: Cervical exam deferred        Extremities: Normal range of motion.  Edema: None  Mental Status: Normal mood and affect. Normal behavior. Normal judgment and thought content.   Assessment and Plan:  Pregnancy: G4P3003 at 27wd 1. Supervision of other normal pregnancy, antepartum (Primary) - on PNV - Tdap updated today  2. [redacted] weeks gestation of pregnancy - she will return tomorrow for GTT and third trimester labs including CBC, HIV and RPR  3. Other iron deficiency anemia - iron  studies and hb electrophoresis order with labs tomorrow as well.  4. Non-English speaking patient - arabic translator present and translated for entire appointment   Preterm labor symptoms and general obstetric precautions including but not limited to vaginal bleeding, contractions, leaking of fluid and fetal movement were reviewed in detail with the patient. Please refer to After Visit Summary for other counseling recommendations.   Return in about 1 day (around 04/28/2024) for 2 hr GTT, 3rd trimester labs (RPR, HIV, CBC).  Future Appointments  Date Time Provider Department Center  05/17/2024  3:55 PM Lo, Arland POUR, CNM [facility]  05/31/2024  3:55 PM Lo, Arland POUR, CNM [facility]  06/14/2024  3:55 PM Cleotilde Ronal RAMAN, MD [facility]  06/30/2024  3:55 PM Cleotilde Ronal RAMAN, MD [facility]  07/07/2024  3:35 PM Cleotilde Ronal RAMAN, MD [facility]  07/14/2024  3:55 PM Cleotilde Ronal RAMAN, MD [facility]  07/20/2024  3:55 PM Cleotilde Ronal RAMAN, MD [facility]  07/27/2024  3:55 PM Cleotilde Ronal RAMAN, MD [facility]    Ronal RAMAN Cleotilde, MD

## 2024-05-01 LAB — CBC
Hematocrit: 31.8 % — ABNORMAL LOW (ref 34.0–46.6)
Hemoglobin: 10.1 g/dL — ABNORMAL LOW (ref 11.1–15.9)
MCH: 27.8 pg (ref 26.6–33.0)
MCHC: 31.8 g/dL (ref 31.5–35.7)
MCV: 88 fL (ref 79–97)
Platelets: 238 x10E3/uL (ref 150–450)
RBC: 3.63 x10E6/uL — ABNORMAL LOW (ref 3.77–5.28)
RDW: 13.2 % (ref 11.7–15.4)
WBC: 6.9 x10E3/uL (ref 3.4–10.8)

## 2024-05-01 LAB — HIV ANTIBODY (ROUTINE TESTING W REFLEX): HIV Screen 4th Generation wRfx: NONREACTIVE

## 2024-05-01 LAB — GLUCOSE TOLERANCE, 2 HOURS W/ 1HR
Glucose, 1 hour: 79 mg/dL (ref 70–179)
Glucose, 2 hour: 106 mg/dL (ref 70–152)
Glucose, Fasting: 67 mg/dL — ABNORMAL LOW (ref 70–91)

## 2024-05-01 LAB — HGB FRACTIONATION CASCADE
Hgb A2: 2.7 % (ref 1.8–3.2)
Hgb A: 97.3 % (ref 96.4–98.8)
Hgb F: 0 % (ref 0.0–2.0)
Hgb S: 0 %

## 2024-05-01 LAB — RPR: RPR Ser Ql: NONREACTIVE

## 2024-05-01 LAB — IRON,TIBC AND FERRITIN PANEL
Ferritin: 23 ng/mL (ref 15–150)
Iron Saturation: 20 % (ref 15–55)
Iron: 78 ug/dL (ref 27–159)
Total Iron Binding Capacity: 386 ug/dL (ref 250–450)
UIBC: 308 ug/dL (ref 131–425)

## 2024-05-03 ENCOUNTER — Ambulatory Visit (HOSPITAL_BASED_OUTPATIENT_CLINIC_OR_DEPARTMENT_OTHER): Payer: Self-pay | Admitting: Obstetrics & Gynecology

## 2024-05-17 ENCOUNTER — Ambulatory Visit (INDEPENDENT_AMBULATORY_CARE_PROVIDER_SITE_OTHER): Payer: Self-pay | Admitting: Certified Nurse Midwife

## 2024-05-17 VITALS — Wt 130.2 lb

## 2024-05-17 DIAGNOSIS — Z3480 Encounter for supervision of other normal pregnancy, unspecified trimester: Secondary | ICD-10-CM

## 2024-05-17 DIAGNOSIS — Z3A30 30 weeks gestation of pregnancy: Secondary | ICD-10-CM | POA: Diagnosis not present

## 2024-05-17 DIAGNOSIS — Z3483 Encounter for supervision of other normal pregnancy, third trimester: Secondary | ICD-10-CM

## 2024-05-23 NOTE — Progress Notes (Signed)
   PRENATAL VISIT NOTE  Subjective:  Melanie [name] is a 28 y.o. [phone] at [redacted]w[redacted]d being seen today for ongoing prenatal care.  She is currently monitored for the following issues for this  pregnancy and has GBS bacteriuria; Language barrier; Supervision of other normal pregnancy, antepartum; Anemia of mother in pregnancy; and Absolute anemia on their problem list.  Patient reports no complaints.   .  .   . Denies leaking of fluid.   The following portions of the patient's history were reviewed and updated as appropriate: allergies, current medications, past family history, past medical history, past social history, past surgical history and problem list.   Objective:    Vitals:   05/17/24 1650  Weight: 130 lb 3.2 oz (59.1 kg)    Fetal Status:           General: Alert, oriented and cooperative. Patient is in no acute distress.  Skin: Skin is warm and dry. No rash noted.   Cardiovascular: Normal heart rate noted  Respiratory: Normal respiratory effort, no problems with respiration noted  Abdomen: Soft, gravid, appropriate for gestational age.        Pelvic: Cervical exam deferred        Extremities: Normal range of motion.     Mental Status: Normal mood and affect. Normal behavior. Normal judgment and thought content.   Assessment and Plan:  Pregnancy: G4P3003 at [redacted]w[redacted]d  Antepartum (Primary) - on PNV - Tdap updated today   2. [redacted] weeks gestation of pregnancy   3. Other iron deficiency anemia   4. Non-English speaking patient - arabic translator present and translated for entire appointment     Preterm labor symptoms and general obstetric precautions including but not limited to vaginal bleeding, contractions, leaking of fluid and fetal movement were reviewed in detail with the patient. Please refer to After Visit Summary for other counseling recommendations.   No follow-ups on file.  Future Appointments  Date Time Provider Department Center  05/31/2024   3:55 PM Tad Arland POUR, CNM [facility]  06/30/2024  3:55 PM Cleotilde Ronal RAMAN, MD [facility]  07/07/2024  3:35 PM Cleotilde Ronal RAMAN, MD [facility]  07/13/2024  3:55 PM Cleotilde Ronal RAMAN, MD [facility]  07/20/2024  3:55 PM Cleotilde Ronal RAMAN, MD [facility]  07/27/2024  3:55 PM Cleotilde Ronal RAMAN, MD [facility]    Arland POUR Tad, CNM

## 2024-05-24 NOTE — Telephone Encounter (Signed)
 Spoke with patient via arabic interpreter ID [id number]. Advised of message and results as seen below from Dr.Miller. Patient verbalizes understanding.

## 2024-05-24 NOTE — Telephone Encounter (Signed)
-----   Message from Ronal GORMAN Pinal sent at 05/06/2024 12:17 AM EDT ----- Pt speak arabic.  Not on mychart.  Please let her know her diabetes testing was negative.  Hb is 10.1.  Hemoglobin electrophoresis did not show any abnromalities of the red blood cell.  Ferritin  (iron storage) was 23.  She should take FeSo4 325mg  every other day with OJ.  HIV/RPR negative.  Thanks. ----- Message ----- From: Rebecka Memos Lab Results In Sent: 05/01/2024  11:35 AM EDT To: Ronal GORMAN Pinal, MD

## 2024-05-31 ENCOUNTER — Ambulatory Visit (HOSPITAL_BASED_OUTPATIENT_CLINIC_OR_DEPARTMENT_OTHER): Payer: Self-pay | Admitting: Certified Nurse Midwife

## 2024-05-31 VITALS — BP 96/60 | HR 85 | Wt 133.0 lb

## 2024-05-31 DIAGNOSIS — Z603 Acculturation difficulty: Secondary | ICD-10-CM | POA: Diagnosis not present

## 2024-05-31 DIAGNOSIS — D649 Anemia, unspecified: Secondary | ICD-10-CM | POA: Diagnosis not present

## 2024-05-31 DIAGNOSIS — R300 Dysuria: Secondary | ICD-10-CM

## 2024-05-31 DIAGNOSIS — O99012 Anemia complicating pregnancy, second trimester: Secondary | ICD-10-CM | POA: Diagnosis not present

## 2024-05-31 DIAGNOSIS — O99820 Streptococcus B carrier state complicating pregnancy: Secondary | ICD-10-CM

## 2024-05-31 DIAGNOSIS — Z758 Other problems related to medical facilities and other health care: Secondary | ICD-10-CM

## 2024-05-31 DIAGNOSIS — Z3480 Encounter for supervision of other normal pregnancy, unspecified trimester: Secondary | ICD-10-CM

## 2024-05-31 DIAGNOSIS — R8271 Bacteriuria: Secondary | ICD-10-CM

## 2024-05-31 DIAGNOSIS — Z3A32 32 weeks gestation of pregnancy: Secondary | ICD-10-CM

## 2024-05-31 DIAGNOSIS — O26893 Other specified pregnancy related conditions, third trimester: Secondary | ICD-10-CM

## 2024-05-31 LAB — POCT URINALYSIS DIPSTICK
Bilirubin, UA: NEGATIVE
Glucose, UA: NEGATIVE
Ketones, UA: NEGATIVE
Leukocytes, UA: NEGATIVE
Nitrite, UA: NEGATIVE
Protein, UA: NEGATIVE
Spec Grav, UA: 1.02 (ref 1.010–1.025)
Urobilinogen, UA: 0.2 U/dL
pH, UA: 7 (ref 5.0–8.0)

## 2024-06-01 ENCOUNTER — Ambulatory Visit (HOSPITAL_BASED_OUTPATIENT_CLINIC_OR_DEPARTMENT_OTHER): Payer: Self-pay | Admitting: Certified Nurse Midwife

## 2024-06-01 DIAGNOSIS — O99013 Anemia complicating pregnancy, third trimester: Secondary | ICD-10-CM

## 2024-06-01 LAB — COMPREHENSIVE METABOLIC PANEL WITH GFR
ALT: 7 IU/L (ref 0–32)
AST: 17 IU/L (ref 0–40)
Albumin: 3.4 g/dL — ABNORMAL LOW (ref 4.0–5.0)
Alkaline Phosphatase: 102 IU/L (ref 44–121)
BUN/Creatinine Ratio: 15 (ref 9–23)
BUN: 6 mg/dL (ref 6–20)
Bilirubin Total: <0.2 mg/dL (ref 0.0–1.2)
CO2: 18 mmol/L — ABNORMAL LOW (ref 20–29)
Calcium: 8.7 mg/dL (ref 8.7–10.2)
Chloride: 105 mmol/L (ref 96–106)
Creatinine, Ser: 0.41 mg/dL — ABNORMAL LOW (ref 0.57–1.00)
Globulin, Total: 2.6 g/dL (ref 1.5–4.5)
Glucose: 70 mg/dL (ref 70–99)
Potassium: 3.8 mmol/L (ref 3.5–5.2)
Sodium: 136 mmol/L (ref 134–144)
Total Protein: 6 g/dL (ref 6.0–8.5)
eGFR: 136 mL/min/1.73 (ref >59)

## 2024-06-01 LAB — CBC
Hematocrit: 29.9 % — ABNORMAL LOW (ref 34.0–46.6)
Hemoglobin: 9.7 g/dL — ABNORMAL LOW (ref 11.1–15.9)
MCH: 27.4 pg (ref 26.6–33.0)
MCHC: 32.4 g/dL (ref 31.5–35.7)
MCV: 85 fL (ref 79–97)
Platelets: 232 x10E3/uL (ref 150–450)
RBC: 3.54 x10E6/uL — ABNORMAL LOW (ref 3.77–5.28)
RDW: 12.1 % (ref 11.7–15.4)
WBC: 6.7 x10E3/uL (ref 3.4–10.8)

## 2024-06-01 MED ORDER — FERROUS SULFATE 325 (65 FE) MG PO TBEC: 325.0000 mg | DELAYED_RELEASE_TABLET | Freq: Three times a day (TID) | ORAL | 3 refills | Status: DC

## 2024-06-01 NOTE — Progress Notes (Signed)
   PRENATAL VISIT NOTE  Subjective:  Melanie [name] is a 28 y.o. [phone] at [redacted]w[redacted]d being seen today for ongoing prenatal care.  She is currently monitored for the following issues for this  pregnancy and has GBS bacteriuria; Language barrier; Supervision of other normal pregnancy, antepartum; Anemia of mother in pregnancy; and Absolute anemia on their problem list.  Patient reports no bleeding, no contractions, no cramping, and no leaking.  Contractions: Not present. Vag. Bleeding: None.  Movement: Present. Denies leaking of fluid.   The following portions of the patient's history were reviewed and updated as appropriate: allergies, current medications, past family history, past medical history, past social history, past surgical history and problem list.   Objective:    Vitals:   05/31/24 1614  BP: 96/60  Pulse: 85  Weight: 133 lb (60.3 kg)    Fetal Status:      Movement: Present    General: Alert, oriented and cooperative. Patient is in no acute distress.  Skin: Skin is warm and dry. No rash noted.   Cardiovascular: Normal heart rate noted  Respiratory: Normal respiratory effort, no problems with respiration noted  Abdomen: Soft, gravid, appropriate for gestational age.  Pain/Pressure: Present     Pelvic: Cervical exam deferred        Extremities: Normal range of motion.  Edema: Trace  Mental Status: Normal mood and affect. Normal behavior. Normal judgment and thought content.   Assessment and Plan:  Pregnancy: G4P3003 at [redacted]w[redacted]d  1. Supervision of other normal pregnancy, antepartum (Primary) - Urine Culture - CBC - Comp Met (CMET)  2. GBS bacteriuria  3. Anemia during pregnancy in second trimester  4. Dysuria - POCT Urinalysis Dipstick  5. Language barrier - Print production planner present and translated (in person)  6. [redacted] weeks gestation of pregnancy   Preterm labor symptoms and general obstetric precautions including but not limited to vaginal bleeding,  contractions, leaking of fluid and fetal movement were reviewed in detail with the patient. Please refer to After Visit Summary for other counseling recommendations.   No follow-ups on file.  Future Appointments  Date Time Provider Department Center  06/17/2024  8:35 AM Jaisean Monteforte, Arland POUR, CNM [id number]  06/30/2024  3:55 PM Cleotilde Ronal RAMAN, MD [id number]  07/07/2024  3:35 PM Cleotilde Ronal RAMAN, MD [facility] [phone] [facility]  07/13/2024  3:55 PM Cleotilde Ronal RAMAN, MD [facility] [phone]  07/20/2024  3:55 PM Cleotilde Ronal RAMAN, MD [id number]  07/27/2024  3:55 PM Cleotilde Ronal RAMAN, MD [facility] [phone] [facility]    Arland POUR Roller, CNM

## 2024-06-02 LAB — URINE CULTURE

## 2024-06-14 ENCOUNTER — Other Ambulatory Visit: Payer: Self-pay

## 2024-06-14 ENCOUNTER — Encounter (HOSPITAL_BASED_OUTPATIENT_CLINIC_OR_DEPARTMENT_OTHER): Payer: Self-pay | Admitting: Obstetrics & Gynecology

## 2024-06-14 ENCOUNTER — Encounter (HOSPITAL_COMMUNITY): Payer: Self-pay | Admitting: Obstetrics and Gynecology

## 2024-06-14 ENCOUNTER — Inpatient Hospital Stay (HOSPITAL_COMMUNITY)
Admission: AD | Admit: 2024-06-14 | Discharge: 2024-06-14 | Disposition: A | Discharge status: Home / Self Care | Attending: Obstetrics and Gynecology | Admitting: Obstetrics and Gynecology

## 2024-06-14 DIAGNOSIS — Z3A33 33 weeks gestation of pregnancy: Secondary | ICD-10-CM | POA: Insufficient documentation

## 2024-06-14 DIAGNOSIS — Z3689 Encounter for other specified antenatal screening: Secondary | ICD-10-CM | POA: Diagnosis not present

## 2024-06-14 DIAGNOSIS — O4703 False labor before 37 completed weeks of gestation, third trimester: Secondary | ICD-10-CM | POA: Insufficient documentation

## 2024-06-14 DIAGNOSIS — O368130 Decreased fetal movements, third trimester, not applicable or unspecified: Secondary | ICD-10-CM | POA: Diagnosis present

## 2024-06-14 DIAGNOSIS — Z3490 Encounter for supervision of normal pregnancy, unspecified, unspecified trimester: Secondary | ICD-10-CM

## 2024-06-14 DIAGNOSIS — O4700 False labor before 37 completed weeks of gestation, unspecified trimester: Secondary | ICD-10-CM

## 2024-06-14 LAB — URINALYSIS, ROUTINE W REFLEX MICROSCOPIC
Bilirubin Urine: NEGATIVE
Glucose, UA: NEGATIVE mg/dL
Ketones, ur: NEGATIVE mg/dL
Nitrite: NEGATIVE
Protein, ur: NEGATIVE mg/dL
Specific Gravity, Urine: 1.021 (ref 1.005–1.030)
pH: 5 (ref 5.0–8.0)

## 2024-06-14 LAB — CBC
HCT: 30.9 % — ABNORMAL LOW (ref 36.0–46.0)
Hemoglobin: 10 g/dL — ABNORMAL LOW (ref 12.0–15.0)
MCH: 27.7 pg (ref 26.0–34.0)
MCHC: 32.4 g/dL (ref 30.0–36.0)
MCV: 85.6 fL (ref 80.0–100.0)
Platelets: 254 K/uL (ref 150–400)
RBC: 3.61 MIL/uL — ABNORMAL LOW (ref 3.87–5.11)
RDW: 13.2 % (ref 11.5–15.5)
WBC: 6.7 K/uL (ref 4.0–10.5)
nRBC: 0 % (ref 0.0–0.2)

## 2024-06-14 LAB — TYPE AND SCREEN
ABO/RH(D): A POS
Antibody Screen: NEGATIVE

## 2024-06-14 LAB — RAPID HIV SCREEN (HIV 1/2 AB+AG)
HIV 1/2 Antibodies: NONREACTIVE
HIV-1 P24 Antigen - HIV24: NONREACTIVE

## 2024-06-14 MED ORDER — LACTATED RINGERS IV BOLUS
1000.0000 mL | Freq: Once | INTRAVENOUS | Status: AC
  Administered 2024-06-14: 1000 mL via INTRAVENOUS

## 2024-06-14 MED ORDER — BETAMETHASONE SOD PHOS & ACET 6 (3-3) MG/ML IJ SUSP
12.0000 mg | Freq: Once | INTRAMUSCULAR | Status: AC
  Administered 2024-06-14: 12 mg via INTRAMUSCULAR
  Filled 2024-06-14: qty 5

## 2024-06-14 NOTE — Discharge Instructions (Addendum)
 You were seen in the maternity assessment unit for preterm contractions.  Your initial cervical exam showed that you are 2 to 3 cm dilated.  You were observed for over 2 hours.  Your cervical exam did not change.  You were also given fluids while you were here.  We gave you a dose of steroids to help mature your baby son.  You will need to return tomorrow between 3 and 5 PM to receive a second dose of this medication.    Come to the MAU (maternity admission unit) for 1) Strong contractions every 2-3 minutes for at least 1 hour that do not go away when you drink water or take a warm shower. These contractions will be so strong all you can do is breath through them 2) Vaginal bleeding- anything more than spotting 3) Loss of fluid like you broke your water 4) Decreased movement of your baby

## 2024-06-14 NOTE — MAU Provider Note (Signed)
 History     CSN: [id number] Arrival date and time: 06/14/24 1851   Event Date/Time   First Provider Initiated Contact with Patient 06/14/24 1941      Chief Complaint  Patient presents with   Abdominal Pain   Decreased Fetal Movement    28 y.o. [id number] [redacted]w[redacted]d here with complaints of  contractions starting this AM. Reports they are strong and feel like labor. She reports no complications in this pregnancy. She has had three term deliveries prior. Reports slightly decreased FM but still feeling them.   +FM, denies LOF, VB, contractions, vaginal discharge.  Arabic iPad interpreter used.  Abdominal Pain Pertinent negatives include no diarrhea, dysuria, fever, nausea or vomiting.    OB History     Gravida  4   Para  3   Term  3   Preterm      AB      Living  3      SAB      IAB      Ectopic      Multiple  0   Live Births  3           Past Medical History:  Diagnosis Date   History of anemia 11/23/2021    Past Surgical History:  Procedure Laterality Date   NO PAST SURGERIES      Family History  Problem Relation Age of Onset   Liver disease Mother    Hypertension Father     Social History   Tobacco Use   Smoking status: Never   Smokeless tobacco: Never  Vaping Use   Vaping status: Never Used  Substance Use Topics   Alcohol use: Never   Drug use: Never    Allergies:  Allergies  Allergen Reactions   Pork-Derived Products     Religious practice    Medications Prior to Admission  Medication Sig Dispense Refill Last Dose/Taking   Prenatal Vit-Fe Fumarate-FA (PRENATAL COMPLETE) 14-0.4 MG TABS Take 1 tablet by mouth daily. 90 tablet 0 06/14/2024   Ferrous Gluconate (IRON 27 PO) Take 1 tablet by mouth daily.      ferrous sulfate  325 (65 FE) MG EC tablet Take 1 tablet (325 mg total) by mouth 3 (three) times daily with meals. 90 tablet 3    triamcinolone  cream (KENALOG ) 0.1 % Apply 1 Application topically 2 (two) times daily. 30 g 0      Review of Systems  Constitutional:  Negative for chills and fever.  HENT:  Negative for congestion and sore throat.   Eyes:  Negative for pain and visual disturbance.  Respiratory:  Negative for cough, chest tightness and shortness of breath.   Cardiovascular:  Negative for chest pain.  Gastrointestinal:  Positive for abdominal pain. Negative for diarrhea, nausea and vomiting.  Endocrine: Negative for cold intolerance and heat intolerance.  Genitourinary:  Negative for dysuria and flank pain.  Musculoskeletal:  Negative for back pain.  Skin:  Negative for rash.  Allergic/Immunologic: Negative for food allergies.  Neurological:  Negative for dizziness and light-headedness.  Psychiatric/Behavioral:  Negative for agitation.    Physical Exam   Blood pressure 107/65, pulse 82, temperature 98.8 F (37.1 C), temperature source Oral, resp. rate 18, height 5' 1.02 (1.55 m), weight 60.7 kg, last menstrual period 10/21/2023, SpO2 100%, currently breastfeeding.  Physical Exam Vitals and nursing note reviewed.  Constitutional:      General: She is not in acute distress.    Appearance: She is well-developed.  Comments: Pregnant female  HENT:     Head: Normocephalic and atraumatic.  Eyes:     General: No scleral icterus.    Conjunctiva/sclera: Conjunctivae normal.  Cardiovascular:     Rate and Rhythm: Normal rate.  Pulmonary:     Effort: Pulmonary effort is normal.  Chest:     Chest wall: No tenderness.  Abdominal:     Palpations: Abdomen is soft.     Tenderness: There is no abdominal tenderness. There is no guarding or rebound.     Comments: Gravid  Genitourinary:    Vagina: Normal.  Musculoskeletal:        General: Normal range of motion.     Cervical back: Normal range of motion and neck supple.  Skin:    General: Skin is warm and dry.     Findings: No rash.  Neurological:     Mental Status: She is alert and oriented to person, place, and time.     Initial cervical  exam Dilation: 3 Effacement (%): 50 Cervical Position: Anterior Station: Ballotable Exam by:: Eldonna, MD  Repeat exam Dilation: 2.5 Effacement (%): 50 Cervical Position: Anterior Station: Ballotable Presentation: Vertex Exam by:: Suzen Eldonna MD  MAU Course  Procedures  I reviewed the patient's fetal monitoring.  Baseline HR: 145 Variability:  moderate Accels:present Decels: none  A/P: Reactive NST.  Reassured regarding fetal status.    US  performed at bedside to confirm presentation  MDM- high R/o PTL Initial exam was 3cm--> repeat was 2.5cm Observation for 2+ hours 1 L LR  Orders Placed This Encounter  Procedures   Urinalysis, Routine w reflex microscopic -Urine, Clean Catch   CBC   RPR   Rapid HIV screen (HIV 1/2 Ab+Ag)   Type and screen [facility]   Insert peripheral IV   Discharge patient Discharge disposition: 01-Home or Self Care; Discharge patient date: 06/14/2024    Assessment and Plan   1. Preterm uterine contractions   2. NST (non-stress test) reactive   3. Dilation of cervix of 2 cm   4. [redacted] weeks gestation of pregnancy    - Discharged stable condition - Stable cervical exam - BMZ#1 received in MAU - Return precautions reviewed in detail with arabic interpreter  Suzen Maryan Eldonna 06/14/2024, 10:19 PM

## 2024-06-14 NOTE — MAU Note (Signed)
 MAU Triage Note  .Melanie [name] [name] is a 27 y.o. at [redacted]w[redacted]d here in MAU reporting: reports abd cramping since this morning, denies VB and LOF. Reports intermittent vaginal burning that occurs when she pees, and also occasionally at rest Reports +FM, but reports it's less than usual.   Onset of complaint: this morning Pain score: 5/10 abd  5/10 vagina  Vitals:   06/14/24 1914  BP: 101/65  Pulse: (!) 104  Resp: 18  Temp: 98.8 F (37.1 C)  SpO2: 99%     FHT: 150  Lab orders placed from triage: UA

## 2024-06-15 ENCOUNTER — Inpatient Hospital Stay (HOSPITAL_COMMUNITY)
Admission: AD | Admit: 2024-06-15 | Discharge: 2024-06-15 | Disposition: A | Discharge status: Home / Self Care | Payer: Self-pay | Attending: Obstetrics and Gynecology | Admitting: Obstetrics and Gynecology

## 2024-06-15 DIAGNOSIS — Z79899 Other long term (current) drug therapy: Secondary | ICD-10-CM | POA: Diagnosis not present

## 2024-06-15 DIAGNOSIS — Z3480 Encounter for supervision of other normal pregnancy, unspecified trimester: Secondary | ICD-10-CM

## 2024-06-15 DIAGNOSIS — O479 False labor, unspecified: Secondary | ICD-10-CM

## 2024-06-15 DIAGNOSIS — Z3A34 34 weeks gestation of pregnancy: Secondary | ICD-10-CM

## 2024-06-15 DIAGNOSIS — O4703 False labor before 37 completed weeks of gestation, third trimester: Secondary | ICD-10-CM | POA: Diagnosis present

## 2024-06-15 LAB — RPR: RPR Ser Ql: NONREACTIVE

## 2024-06-15 MED ORDER — BETAMETHASONE SOD PHOS & ACET 6 (3-3) MG/ML IJ SUSP
12.0000 mg | Freq: Once | INTRAMUSCULAR | Status: AC
  Administered 2024-06-15: 12 mg via INTRAMUSCULAR

## 2024-06-15 NOTE — MAU Note (Signed)
 Melanie [name] is a 28 y.o. at [redacted]w[redacted]d here in MAU reporting: need for second betamethasone  shot. NO c/o SROM, vaginal bleeding, contractions, HA, chest pain, or visual disturbances. SABRA  +FM  Onset of complaint: yesterday order Pain score: 0/10 Vitals:   06/15/24 1552  BP: (!) 95/55  Pulse: (!) 101  Resp: 17  Temp: 98.7 F (37.1 C)  SpO2: 100%     FHT: 145  Lab orders placed from triage: see md MD in triage to see pt

## 2024-06-15 NOTE — MAU Provider Note (Signed)
 History     [id number]  Arrival date and time: 06/15/24 1441    Chief Complaint  Patient presents with   betamethasone      HPI Melanie [name] is a 28 y.o. at [redacted]w[redacted]d by LMP, who presents for second betamethasone  shot. She was evaluated for preterm labor on 9/9 and given first dose of betamethasone  and told to come back today for second dose.    --/--/A POS (09/09 2043)  Past Medical History:  Diagnosis Date   History of anemia 11/23/2021    Past Surgical History:  Procedure Laterality Date   NO PAST SURGERIES      Family History  Problem Relation Age of Onset   Liver disease Mother    Hypertension Father     Social History   Socioeconomic History   Marital status: Married    Spouse name: Adel   Number of children: 2   Years of education: Not on file   Highest education level: 4th grade  Occupational History   Not on file  Tobacco Use   Smoking status: Never   Smokeless tobacco: Never  Vaping Use   Vaping status: Never Used  Substance and Sexual Activity   Alcohol use: Never   Drug use: Never   Sexual activity: Yes  Other Topics Concern   Not on file  Social History Narrative   Not on file   Social Drivers of Health   Financial Resource Strain: Not on file  Food Insecurity: No Food Insecurity (01/11/2024)   Hunger Vital Sign    Worried About Running Out of Food in the Last Year: Never true    Ran Out of Food in the Last Year: Never true  Transportation Needs: No Transportation Needs (01/11/2024)   PRAPARE - Administrator, Civil Service (Medical): No    Lack of Transportation (Non-Medical): No  Physical Activity: Not on file  Stress: Not on file  Social Connections: Not on file  Intimate Partner Violence: Not on file    Allergies  Allergen Reactions   Pork-Derived Products     Religious practice    No current facility-administered medications on file prior to encounter.   Current Outpatient Medications on File  Prior to Encounter  Medication Sig Dispense Refill   Ferrous Gluconate (IRON 27 PO) Take 1 tablet by mouth daily.     ferrous sulfate  325 (65 FE) MG EC tablet Take 1 tablet (325 mg total) by mouth 3 (three) times daily with meals. 90 tablet 3   Prenatal Vit-Fe Fumarate-FA (PRENATAL COMPLETE) 14-0.4 MG TABS Take 1 tablet by mouth daily. 90 tablet 0   triamcinolone  cream (KENALOG ) 0.1 % Apply 1 Application topically 2 (two) times daily. 30 g 0    Pertinent positives and negative per HPI, all others reviewed and negative  Physical Exam   BP (!) 95/55 (BP Location: Right Arm)   Pulse (!) 101   Temp 98.7 F (37.1 C) (Oral)   Resp 17   LMP 10/21/2023   SpO2 100%   Patient Vitals for the past 24 hrs:  BP Temp Temp src Pulse Resp SpO2  06/15/24 1552 (!) 95/55 98.7 F (37.1 C) Oral (!) 101 17 100 %    Physical Exam Vitals and nursing note reviewed.  Constitutional:      Appearance: She is well-developed.  HENT:     Head: Normocephalic and atraumatic.     Mouth/Throat:     Mouth: Mucous membranes are moist.  Eyes:  Extraocular Movements: Extraocular movements intact.  Cardiovascular:     Rate and Rhythm: Normal rate and regular rhythm.  Pulmonary:     Effort: Pulmonary effort is normal.  Abdominal:     Palpations: Abdomen is soft.     Tenderness: There is no abdominal tenderness.  Skin:    Capillary Refill: Capillary refill takes less than 2 seconds.  Neurological:     General: No focal deficit present.     Mental Status: She is alert.     Labs Results for orders placed or performed during the hospital encounter of 06/14/24 (from the past 24 hours)  Urinalysis, Routine w reflex microscopic -Urine, Clean Catch     Status: Abnormal   Collection Time: 06/14/24  7:34 PM  Result Value Ref Range   Color, Urine YELLOW YELLOW   APPearance CLEAR CLEAR   Specific Gravity, Urine 1.021 1.005 - 1.030   pH 5.0 5.0 - 8.0   Glucose, UA NEGATIVE NEGATIVE mg/dL   Hgb urine dipstick  MODERATE (A) NEGATIVE   Bilirubin Urine NEGATIVE NEGATIVE   Ketones, ur NEGATIVE NEGATIVE mg/dL   Protein, ur NEGATIVE NEGATIVE mg/dL   Nitrite NEGATIVE NEGATIVE   Leukocytes,Ua TRACE (A) NEGATIVE   RBC / HPF 0-5 0 - 5 RBC/hpf   WBC, UA 6-10 0 - 5 WBC/hpf   Bacteria, UA RARE (A) NONE SEEN   Squamous Epithelial / HPF 0-5 0 - 5 /HPF   Mucus PRESENT    Non Squamous Epithelial 0-5 (A) NONE SEEN  CBC     Status: Abnormal   Collection Time: 06/14/24  8:43 PM  Result Value Ref Range   WBC 6.7 4.0 - 10.5 K/uL   RBC 3.61 (L) 3.87 - 5.11 MIL/uL   Hemoglobin 10.0 (L) 12.0 - 15.0 g/dL   HCT 69.0 (L) 63.9 - 53.9 %   MCV 85.6 80.0 - 100.0 fL   MCH 27.7 26.0 - 34.0 pg   MCHC 32.4 30.0 - 36.0 g/dL   RDW 86.7 88.4 - 84.4 %   Platelets 254 150 - 400 K/uL   nRBC 0.0 0.0 - 0.2 %  Type and screen [facility]     Status: None   Collection Time: 06/14/24  8:43 PM  Result Value Ref Range   ABO/RH(D) A POS    Antibody Screen NEG    Sample Expiration      06/17/2024,2359 Performed at [facility], [address] [place], [place] [address]   RPR     Status: None   Collection Time: 06/14/24  8:43 PM  Result Value Ref Range   RPR Ser Ql NON REACTIVE NON REACTIVE  Rapid HIV screen (HIV 1/2 Ab+Ag)     Status: None   Collection Time: 06/14/24  8:43 PM  Result Value Ref Range   HIV-1 P24 Antigen - HIV24 NON REACTIVE NON REACTIVE   HIV 1/2 Antibodies NON REACTIVE NON REACTIVE   Interpretation (HIV Ag Ab)      A non reactive test result means that HIV 1 or HIV 2 antibodies and HIV 1 p24 antigen were not detected in the specimen.    Imaging No results found.  MAU Course  Procedures Lab Orders  No laboratory test(s) ordered today   Meds ordered this encounter  Medications   betamethasone  acetate-betamethasone  sodium phosphate (CELESTONE ) injection 12 mg   Imaging Orders  No imaging studies ordered today    MDM Minimal (Level 1)  Assessment and Plan  #Concern for  preterm labor #[redacted] weeks gestation of pregnancy  Patient presents for 2nd betamethasone  shot. She endorses good FM, no LOF, vaginal bleeding or contractions. She has no concerns today.   Shanetha Bradham L Anicia Leuthold, MD/MHA 06/15/24 3:59 PM  Allergies as of 06/15/2024       Reactions   Pork-derived Products    Religious practice        Medication List     TAKE these medications    ferrous sulfate  325 (65 FE) MG EC tablet Take 1 tablet (325 mg total) by mouth 3 (three) times daily with meals.   IRON 27 PO Take 1 tablet by mouth daily.   Prenatal Complete 14-0.4 MG Tabs Take 1 tablet by mouth daily.   triamcinolone  cream 0.1 % Commonly known as: KENALOG  Apply 1 Application topically 2 (two) times daily.

## 2024-06-17 ENCOUNTER — Ambulatory Visit (HOSPITAL_BASED_OUTPATIENT_CLINIC_OR_DEPARTMENT_OTHER): Admitting: Certified Nurse Midwife

## 2024-06-17 ENCOUNTER — Other Ambulatory Visit (HOSPITAL_BASED_OUTPATIENT_CLINIC_OR_DEPARTMENT_OTHER): Payer: Self-pay

## 2024-06-17 VITALS — BP 92/57 | HR 78 | Wt 132.6 lb

## 2024-06-17 DIAGNOSIS — D649 Anemia, unspecified: Secondary | ICD-10-CM | POA: Diagnosis not present

## 2024-06-17 DIAGNOSIS — O99820 Streptococcus B carrier state complicating pregnancy: Secondary | ICD-10-CM

## 2024-06-17 DIAGNOSIS — R8271 Bacteriuria: Secondary | ICD-10-CM

## 2024-06-17 DIAGNOSIS — O99013 Anemia complicating pregnancy, third trimester: Secondary | ICD-10-CM

## 2024-06-17 DIAGNOSIS — Z3480 Encounter for supervision of other normal pregnancy, unspecified trimester: Secondary | ICD-10-CM

## 2024-06-17 DIAGNOSIS — Z3A34 34 weeks gestation of pregnancy: Secondary | ICD-10-CM

## 2024-06-17 DIAGNOSIS — Z603 Acculturation difficulty: Secondary | ICD-10-CM | POA: Diagnosis not present

## 2024-06-17 DIAGNOSIS — Z758 Other problems related to medical facilities and other health care: Secondary | ICD-10-CM

## 2024-06-17 MED ORDER — RSV PRE-FUSION F A&B VAC RCMB 120 MCG/0.5ML IM SOLR
0.5000 mL | Freq: Once | INTRAMUSCULAR | 0 refills | Status: AC
Start: 2024-06-17 — End: 2024-06-18
  Filled 2024-06-17: qty 0.5, 1d supply, fill #0

## 2024-06-17 MED ORDER — ACCRUFER 30 MG PO CAPS
30.0000 mg | ORAL_CAPSULE | Freq: Every day | ORAL | 1 refills | Status: DC
  Filled 2024-06-17: qty 30, 30d supply, fill #0

## 2024-06-17 NOTE — Progress Notes (Signed)
   PRENATAL VISIT NOTE  Subjective:  Melanie [name] is a 28 y.o. [phone] at [redacted]w[redacted]d being seen today for ongoing prenatal care.  She is currently monitored for the following issues for this low-risk pregnancy and has GBS bacteriuria; Language barrier; Supervision of other normal pregnancy, antepartum; Anemia of mother in pregnancy; and Absolute anemia on their problem list.  Patient reports no bleeding, no contractions, no cramping, and no leaking.   .  .   . Denies leaking of fluid.   The following portions of the patient's history were reviewed and updated as appropriate: allergies, current medications, past family history, past medical history, past social history, past surgical history and problem list.   Objective:    Vitals:   06/17/24 0823  BP: (!) 92/57  Pulse: 78  Weight: 132 lb 9.6 oz (60.1 kg)    Fetal Status:           General: Alert, oriented and cooperative. Patient is in no acute distress.  Skin: Skin is warm and dry. No rash noted.   Cardiovascular: Normal heart rate noted  Respiratory: Normal respiratory effort, no problems with respiration noted  Abdomen: Soft, gravid, appropriate for gestational age.        Pelvic: Cervical exam deferred        Extremities: Normal range of motion.     Mental Status: Normal mood and affect. Normal behavior. Normal judgment and thought content.   Assessment and Plan:  Pregnancy: G4P3003 at [redacted]w[redacted]d  1. Anemia during pregnancy in third trimester (Primary) - Pt states she could not tolerate Ferrous Sulfate  - Will send new Rx for Accrufer  daily  2. Language barrier - Medical Interpreter present (in-person) for 100% of visit  3. Supervision of other normal pregnancy, antepartum - Discussed availability of RSV Vaccine today. Booklet given. - Pt/spouse will review and she will receive RSV Vaccine from pharmacy today  4. GBS bacteriuria - Plan Intrapartum prophylaxis  5. [redacted] weeks gestation of pregnancy   Preterm  labor symptoms and general obstetric precautions including but not limited to vaginal bleeding, contractions, leaking of fluid and fetal movement were reviewed in detail with the patient.  Please refer to After Visit Summary for other counseling recommendations.    Future Appointments  Date Time Provider Department Center  06/30/2024  3:55 PM Cleotilde Ronal RAMAN, MD [facility] [id number]  07/07/2024  3:35 PM Cleotilde Ronal RAMAN, MD [facility] [phone] [facility]  07/13/2024  3:55 PM Cleotilde Ronal RAMAN, MD [facility] [phone] [facility]  07/20/2024  3:55 PM Cleotilde Ronal RAMAN, MD [facility] [id number]  07/27/2024  3:55 PM Cleotilde Ronal RAMAN, MD [facility] [phone] [facility]    Arland MARLA Roller, CNM

## 2024-06-21 LAB — URINE CULTURE

## 2024-06-30 ENCOUNTER — Encounter (HOSPITAL_BASED_OUTPATIENT_CLINIC_OR_DEPARTMENT_OTHER): Payer: Self-pay | Admitting: Obstetrics & Gynecology

## 2024-07-07 ENCOUNTER — Ambulatory Visit (HOSPITAL_BASED_OUTPATIENT_CLINIC_OR_DEPARTMENT_OTHER): Payer: Self-pay | Admitting: Obstetrics & Gynecology

## 2024-07-07 ENCOUNTER — Other Ambulatory Visit (HOSPITAL_COMMUNITY)
Admission: RE | Admit: 2024-07-07 | Discharge: 2024-07-07 | Disposition: A | Discharge status: Home / Self Care | Source: Ambulatory Visit | Attending: Obstetrics & Gynecology | Admitting: Obstetrics & Gynecology

## 2024-07-07 VITALS — BP 104/65 | HR 95 | Wt 136.0 lb

## 2024-07-07 DIAGNOSIS — O99013 Anemia complicating pregnancy, third trimester: Secondary | ICD-10-CM | POA: Diagnosis not present

## 2024-07-07 DIAGNOSIS — O99820 Streptococcus B carrier state complicating pregnancy: Secondary | ICD-10-CM

## 2024-07-07 DIAGNOSIS — D649 Anemia, unspecified: Secondary | ICD-10-CM

## 2024-07-07 DIAGNOSIS — Z3A37 37 weeks gestation of pregnancy: Secondary | ICD-10-CM

## 2024-07-07 DIAGNOSIS — Z603 Acculturation difficulty: Secondary | ICD-10-CM

## 2024-07-07 DIAGNOSIS — Z3480 Encounter for supervision of other normal pregnancy, unspecified trimester: Secondary | ICD-10-CM | POA: Insufficient documentation

## 2024-07-07 DIAGNOSIS — Z758 Other problems related to medical facilities and other health care: Secondary | ICD-10-CM

## 2024-07-07 DIAGNOSIS — R8271 Bacteriuria: Secondary | ICD-10-CM

## 2024-07-07 NOTE — Progress Notes (Signed)
   PRENATAL VISIT NOTE  Subjective:  Melanie [name] is a 28 y.o. [phone] at [redacted]w[redacted]d being seen today for ongoing prenatal care.  She is currently monitored for the following issues for this low-risk pregnancy and has GBS bacteriuria; Language barrier; Supervision of other normal pregnancy, antepartum; Anemia of mother in pregnancy; and Absolute anemia on their problem list.  Patient reports no complaints.  Contractions: Irregular (pt reports cramping in lower abdomen only in the mornings). Vag. Bleeding: None.  Movement: Present. Denies leaking of fluid.   The following portions of the patient's history were reviewed and updated as appropriate: allergies, current medications, past family history, past medical history, past social history, past surgical history and problem list.   Objective:    Vitals:   07/07/24 1545  BP: 104/65  Pulse: 95  Weight: 136 lb (61.7 kg)    Fetal Status:  Fetal Heart Rate (bpm): 143 Fundal Height: 36 cm Movement: Present Presentation: Vertex  General: Alert, oriented and cooperative. Patient is in no acute distress.  Skin: Skin is warm and dry. No rash noted.   Cardiovascular: Normal heart rate noted  Respiratory: Normal respiratory effort, no problems with respiration noted  Abdomen: Soft, gravid, appropriate for gestational age.  Pain/Pressure: Present (patient reports pressure in upper abdomen)     Pelvic: Cervical exam deferred        Extremities: Normal range of motion.  Edema: None  Mental Status: Normal mood and affect. Normal behavior. Normal judgment and thought content.   Assessment and Plan:  Pregnancy: G4P3003 at [redacted]w[redacted]d 1. Supervision of other normal pregnancy, antepartum (Primary) - on PNV - Cervicovaginal ancillary only( [place]) - GBS not needed  2. [redacted] weeks gestation of pregnancy - recheck 1 week  3. Anemia during pregnancy in third trimester - on iron   4. GBS bacteriuria - pt aware will need to be treated in  labor  5. Language barrier - Spanish interpreter [place]  Preterm labor symptoms and general obstetric precautions including but not limited to vaginal bleeding, contractions, leaking of fluid and fetal movement were reviewed in detail with the patient. Please refer to After Visit Summary for other counseling recommendations.   Return in about 1 week (around 07/14/2024).  Future Appointments  Date Time Provider Department Center  07/13/2024  3:55 PM Cleotilde Ronal RAMAN, MD [facility] [id number]  07/20/2024  3:55 PM Cleotilde Ronal RAMAN, MD [facility] [phone] [facility]  07/27/2024  3:55 PM Cleotilde Ronal RAMAN, MD [facility] [phone] [facility]    Ronal RAMAN Cleotilde, MD

## 2024-07-08 LAB — CERVICOVAGINAL ANCILLARY ONLY
Chlamydia: NEGATIVE
Comment: NEGATIVE
Comment: NORMAL
Neisseria Gonorrhea: NEGATIVE

## 2024-07-09 ENCOUNTER — Encounter (HOSPITAL_BASED_OUTPATIENT_CLINIC_OR_DEPARTMENT_OTHER): Payer: Self-pay | Admitting: Obstetrics & Gynecology

## 2024-07-12 ENCOUNTER — Inpatient Hospital Stay (HOSPITAL_COMMUNITY)
Admission: AD | Admit: 2024-07-12 | Discharge: 2024-07-12 | Disposition: A | Discharge status: Home / Self Care | Attending: Obstetrics and Gynecology | Admitting: Obstetrics and Gynecology

## 2024-07-12 ENCOUNTER — Encounter (HOSPITAL_COMMUNITY): Payer: Self-pay | Admitting: Obstetrics and Gynecology

## 2024-07-12 DIAGNOSIS — Z3A37 37 weeks gestation of pregnancy: Secondary | ICD-10-CM | POA: Diagnosis not present

## 2024-07-12 DIAGNOSIS — Z3480 Encounter for supervision of other normal pregnancy, unspecified trimester: Secondary | ICD-10-CM

## 2024-07-12 DIAGNOSIS — O471 False labor at or after 37 completed weeks of gestation: Secondary | ICD-10-CM | POA: Diagnosis present

## 2024-07-12 DIAGNOSIS — O479 False labor, unspecified: Secondary | ICD-10-CM | POA: Diagnosis not present

## 2024-07-12 HISTORY — DX: Anemia, unspecified: D64.9

## 2024-07-12 NOTE — MAU Provider Note (Signed)
 Ms. Melanie [name] is a [id number] at [redacted]w[redacted]d seen in MAU for labor. RN labor check, not seen by provider.   SVE by RN Dilation: 3 Effacement (%): 60 Cervical Position: Middle Station: -2 Presentation: Vertex Exam by:: Oleh Loges RN   NST - FHR: 125 bpm / moderate variability / accels present / decels absent NST reactive / TOCO: irregular, every 2-10 minutes  Plan:  D/C home with labor precautions Keep scheduled appt with [facility]  Olam Boards, CNM  07/12/2024 6:02 AM

## 2024-07-12 NOTE — Discharge Instructions (Signed)
 Reasons to return to MAU at [facility]:  1.  Contractions are  5 minutes apart or less, each last 1 minute, these have been going on for 1-2 hours, and you cannot walk or talk during them 2.  You have a large gush of fluid, or a trickle of fluid that will not stop and you have to wear a pad 3.  You have bleeding that is bright red, heavier than spotting--like menstrual bleeding (spotting can be normal in early labor or after a check of your cervix) 4.  You do not feel the baby moving like he/she normally does

## 2024-07-12 NOTE — MAU Note (Signed)
 Melanie [name] Melanie [name] is a 28 y.o. at [redacted]w[redacted]d here in MAU reporting: Ctx started 1 hour ago about q 5 min. Denies any leaking or bleeding at this time. Good fetal movement reported. #cm last cervical check.   LMP:  Onset of complaint: 1hr Pain score: 5 There were no vitals filed for this visit.   FHT: defere until in room  Lab orders placed from triage: labor eval

## 2024-07-13 ENCOUNTER — Ambulatory Visit (HOSPITAL_BASED_OUTPATIENT_CLINIC_OR_DEPARTMENT_OTHER): Admitting: Obstetrics & Gynecology

## 2024-07-13 VITALS — BP 98/57 | HR 103 | Wt 137.0 lb

## 2024-07-13 DIAGNOSIS — D649 Anemia, unspecified: Secondary | ICD-10-CM

## 2024-07-13 DIAGNOSIS — Z3A38 38 weeks gestation of pregnancy: Secondary | ICD-10-CM

## 2024-07-13 DIAGNOSIS — O99013 Anemia complicating pregnancy, third trimester: Secondary | ICD-10-CM

## 2024-07-13 DIAGNOSIS — O99820 Streptococcus B carrier state complicating pregnancy: Secondary | ICD-10-CM

## 2024-07-13 DIAGNOSIS — R8271 Bacteriuria: Secondary | ICD-10-CM

## 2024-07-13 DIAGNOSIS — Z603 Acculturation difficulty: Secondary | ICD-10-CM | POA: Diagnosis not present

## 2024-07-13 DIAGNOSIS — Z3480 Encounter for supervision of other normal pregnancy, unspecified trimester: Secondary | ICD-10-CM

## 2024-07-14 ENCOUNTER — Encounter (HOSPITAL_BASED_OUTPATIENT_CLINIC_OR_DEPARTMENT_OTHER): Payer: Self-pay | Admitting: Obstetrics & Gynecology

## 2024-07-19 NOTE — Progress Notes (Signed)
   PRENATAL VISIT NOTE  Subjective:  Melanie [name] is a 28 y.o. [phone] at [redacted]w[redacted]d being seen today for ongoing prenatal care.  She is currently monitored for the following issues for this low-risk pregnancy and has GBS bacteriuria; Language barrier; Supervision of other normal pregnancy, antepartum; Anemia of mother in pregnancy; and Absolute anemia on their problem list.  Patient reports some intermittent contractions.  Did go to MAU yesterday.  Contractions: Irregular. Vag. Bleeding: None.  Movement: Present. Denies leaking of fluid.   The following portions of the patient's history were reviewed and updated as appropriate: allergies, current medications, past family history, past medical history, past social history, past surgical history and problem list.   Objective:    Vitals:   07/13/24 1550  BP: (!) 98/57  Pulse: (!) 103  Weight: 137 lb (62.1 kg)    Fetal Status:  Fetal Heart Rate (bpm): 143 Fundal Height: 36 cm Movement: Present Presentation: Vertex  General: Alert, oriented and cooperative. Patient is in no acute distress.  Skin: Skin is warm and dry. No rash noted.   Cardiovascular: Normal heart rate noted  Respiratory: Normal respiratory effort, no problems with respiration noted  Abdomen: Soft, gravid, appropriate for gestational age.  Pain/Pressure: Present (lower pelvic pain)     Pelvic: Cervical exam performed in the presence of a chaperone Dilation: 4 Effacement (%): 50 Station: -3  Extremities: Normal range of motion.  Edema: None  Mental Status: Normal mood and affect. Normal behavior. Normal judgment and thought content.   Assessment and Plan:  Pregnancy: G4P3003 at [redacted]w[redacted]d 1. [redacted] weeks gestation of pregnancy - on PNV - Recheck 1 week  2. Supervision of other normal pregnancy, antepartum (Primary)  3. Language barrier -translator used for entire appointment  4. GBS bacteriuria - aware will need treatment in L&D  5. Anemia during pregnancy in  third trimester - on oral iron  Term labor symptoms and general obstetric precautions including but not limited to vaginal bleeding, contractions, leaking of fluid and fetal movement were reviewed in detail with the patient. Please refer to After Visit Summary for other counseling recommendations.   Return in about 1 week (around 07/20/2024).  Future Appointments  Date Time Provider Department Center  07/20/2024  3:55 PM Cleotilde Ronal RAMAN, MD [facility] [id number]  07/27/2024  3:55 PM Cleotilde Ronal RAMAN, MD [facility] [phone] [facility]    Ronal RAMAN Cleotilde, MD

## 2024-07-20 ENCOUNTER — Encounter (HOSPITAL_COMMUNITY): Payer: Self-pay | Admitting: Obstetrics and Gynecology

## 2024-07-20 ENCOUNTER — Inpatient Hospital Stay (HOSPITAL_COMMUNITY)

## 2024-07-20 ENCOUNTER — Ambulatory Visit (HOSPITAL_BASED_OUTPATIENT_CLINIC_OR_DEPARTMENT_OTHER): Payer: Self-pay | Admitting: Obstetrics & Gynecology

## 2024-07-20 ENCOUNTER — Inpatient Hospital Stay (HOSPITAL_COMMUNITY)
Admission: AD | Admit: 2024-07-20 | Discharge: 2024-07-20 | Disposition: A | Discharge status: Home / Self Care | Payer: Self-pay | Attending: Obstetrics and Gynecology | Admitting: Obstetrics and Gynecology

## 2024-07-20 VITALS — BP 116/65 | HR 92 | Wt 138.6 lb

## 2024-07-20 DIAGNOSIS — O99013 Anemia complicating pregnancy, third trimester: Secondary | ICD-10-CM

## 2024-07-20 DIAGNOSIS — Z3A39 39 weeks gestation of pregnancy: Secondary | ICD-10-CM

## 2024-07-20 DIAGNOSIS — Z603 Acculturation difficulty: Secondary | ICD-10-CM

## 2024-07-20 DIAGNOSIS — O368330 Maternal care for abnormalities of the fetal heart rate or rhythm, third trimester, not applicable or unspecified: Secondary | ICD-10-CM | POA: Insufficient documentation

## 2024-07-20 DIAGNOSIS — O4103X0 Oligohydramnios, third trimester, not applicable or unspecified: Secondary | ICD-10-CM

## 2024-07-20 DIAGNOSIS — R8271 Bacteriuria: Secondary | ICD-10-CM

## 2024-07-20 DIAGNOSIS — D649 Anemia, unspecified: Secondary | ICD-10-CM

## 2024-07-20 DIAGNOSIS — Z758 Other problems related to medical facilities and other health care: Secondary | ICD-10-CM | POA: Insufficient documentation

## 2024-07-20 DIAGNOSIS — O99820 Streptococcus B carrier state complicating pregnancy: Secondary | ICD-10-CM | POA: Diagnosis not present

## 2024-07-20 DIAGNOSIS — O368130 Decreased fetal movements, third trimester, not applicable or unspecified: Secondary | ICD-10-CM | POA: Diagnosis not present

## 2024-07-20 DIAGNOSIS — Z3480 Encounter for supervision of other normal pregnancy, unspecified trimester: Secondary | ICD-10-CM

## 2024-07-20 DIAGNOSIS — O471 False labor at or after 37 completed weeks of gestation: Secondary | ICD-10-CM | POA: Diagnosis not present

## 2024-07-20 DIAGNOSIS — Z3689 Encounter for other specified antenatal screening: Secondary | ICD-10-CM | POA: Diagnosis not present

## 2024-07-20 HISTORY — DX: Gastro-esophageal reflux disease without esophagitis: K21.9

## 2024-07-20 HISTORY — DX: Depression, unspecified: F32.A

## 2024-07-20 HISTORY — DX: Other seasonal allergic rhinitis: J30.2

## 2024-07-20 HISTORY — DX: Urinary tract infection, site not specified: N39.0

## 2024-07-20 HISTORY — DX: Palpitations: R00.2

## 2024-07-20 NOTE — Progress Notes (Signed)
   PRENATAL VISIT NOTE  Subjective:  Melanie [name] is a 28 y.o. [phone] at [redacted]w[redacted]d being seen today for ongoing prenatal care.  She is currently monitored for the following issues for this low-risk pregnancy and has GBS bacteriuria; Language barrier; Supervision of other normal pregnancy, antepartum; Anemia of mother in pregnancy; and Absolute anemia on their problem list.  Patient reports decreased fetal movement.  Contractions: Irregular. Vag. Bleeding: None.  Movement: Present. Denies leaking of fluid.   The following portions of the patient's history were reviewed and updated as appropriate: allergies, current medications, past family history, past medical history, past social history, past surgical history and problem list.   Objective:    Vitals:   07/20/24 1557  BP: 116/65  Pulse: 92  Weight: 138 lb 9.6 oz (62.9 kg)    Fetal Status:  Fetal Heart Rate (bpm): 143 Fundal Height: 37 cm Movement: Present Presentation: Vertex  General: Alert, oriented and cooperative. Patient is in no acute distress.  Skin: Skin is warm and dry. No rash noted.   Cardiovascular: Normal heart rate noted  Respiratory: Normal respiratory effort, no problems with respiration noted  Abdomen: Soft, gravid, appropriate for gestational age.  Pain/Pressure: Present (lower pelvis)     Pelvic: Cervical exam performed in the presence of a chaperone Dilation: 5 Effacement (%): 50 Station: -3  Extremities: Normal range of motion.  Edema: None  Mental Status: Normal mood and affect. Normal behavior. Normal judgment and thought content.   Assessment and Plan:  Pregnancy: G4P3003 at [redacted]w[redacted]d 1. Supervision of other normal pregnancy, antepartum (Primary) - on PNV  2. [redacted] weeks gestation of pregnancy  3. Decreased fetal movements in third trimester, single or unspecified fetus - NST has good variability but it not reactive.  She is having contractions.  AFI today in office was 5.5.  Placental  calcifications noted.  Sending to L&D for formal BPP and AFI.  Have spoke with Dr. Abigail regarding this pt.   4. GBS bacteriuria  5. Language barrier - translator used throughout appointment  6. Anemia during pregnancy in third trimester - on oral iron   Term labor symptoms and general obstetric precautions including but not limited to vaginal bleeding, contractions, leaking of fluid and fetal movement were reviewed in detail with the patient. Please refer to After Visit Summary for other counseling recommendations.   1 week f/u is scheduled for pt  Future Appointments  Date Time Provider Department Center  07/27/2024  3:55 PM Cleotilde Ronal RAMAN, MD [facility] [phone] [place]    Ronal RAMAN Cleotilde, MD

## 2024-07-20 NOTE — Discharge Instructions (Signed)
 Congratulations, you're on your way to having your baby!!!   You now have an induction scheduled on November 5 for the daytime.  If your induction is scheduled for the daytime, you will see an appointment time in MyChart. Please DO NOT show up at this time, this is just a placeholder on the schedule. You will get a call when your room is ready and will have 2 hours to arrive. The hospital staff can call anytime starting at 5 am through the rest of the day.   You will also get a call from the pre-admission nurse to go over pre-admission screen about 2 days prior to your induction date.   You are also on the wait list for an elective induction starting on October 19. Starting on October 19, if they have any extra room for an induction they will call you to come to the hospital. The latest you will get a call for an induction is November 5.

## 2024-07-20 NOTE — MAU Note (Signed)
 Melanie [name] [name] is a 27 y.o. at [redacted]w[redacted]d here in MAU reporting: was at the clinic today, they should have called.  They had a problem hearing the heart beat or something.  Was on the monitor. Having some contractions, irreg, mild. No bleeding or LOF. small amt of normal vag secretions.   Onset of complaint: appt around 1600 Pain score: mild There were no vitals filed for this visit.   FHT:not feeling much movement, will be putting on monitor, unable to listen initially due to clothing. Lab orders placed from triage:

## 2024-07-20 NOTE — MAU Provider Note (Addendum)
 Chief Complaint:  No chief complaint on file.   HPI   None     Melanie [name] is a 28 y.o. [phone] at [redacted]w[redacted]d who presents to maternity admissions for nonreactive NST and borderline fluid on bedside ultrasound in office today. Denies vaginal bleeding, leaking of fluid. Endorses some irregular mild contractions.   Due to language barrier, a virtual interpreter was present during the history-taking, physical exam, and subsequent discussion with this patient.    Additional history obtained from partner  Pregnancy Course: Receives care at [facility] for [facility] . Prenatal records reviewed. Pregnancy complicated by GBS, anemia.  Past Medical History:  Diagnosis Date   Anemia    Depression    loss of her mother   GERD (gastroesophageal reflux disease)    History of anemia 11/23/2021   Palpitations    Seasonal allergies    UTI (urinary tract infection)    OB History  Gravida Para Term Preterm AB Living  4 3 3   3   SAB IAB Ectopic Multiple Live Births     0 3    # Outcome Date GA Lbr Len/2nd Weight Sex Type Anes PTL Lv  4 Current           3 Term 01/24/22 [redacted]w[redacted]d  2700 g F Vag-Spont None  LIV     Birth Comments: none   2 Term 07/22/17    F Vag-Spont     1 Term 07/22/15    M Vag-Spont  Y    Past Surgical History:  Procedure Laterality Date   NO PAST SURGERIES     Family History  Problem Relation Age of Onset   Liver disease Mother    Heart disease Father    Diabetes Father    Hypertension Father    Social History   Tobacco Use   Smoking status: Never   Smokeless tobacco: Never  Vaping Use   Vaping status: Never Used  Substance Use Topics   Alcohol use: Never   Drug use: Never   Allergies  Allergen Reactions   Porcine (Pork) Protein-Containing Drug Products     Religious practice   Medications Prior to Admission  Medication Sig Dispense Refill Last Dose/Taking   Ferric Maltol  (ACCRUFER ) 30 MG CAPS Take 1 capsule (30 mg total) by  mouth daily. 30 capsule 1 07/18/2024   Prenatal Vit-Fe Fumarate-FA (PRENATAL COMPLETE) 14-0.4 MG TABS Take 1 tablet by mouth daily. 90 tablet 0 More than a month   triamcinolone  cream (KENALOG ) 0.1 % Apply 1 Application topically 2 (two) times daily. 30 g 0     I have reviewed patient's Past Medical Hx, Surgical Hx, Family Hx, Social Hx, medications and allergies.   ROS  Pertinent items noted in HPI and remainder of comprehensive ROS otherwise negative.   PHYSICAL EXAM  Patient Vitals for the past 24 hrs:  BP Temp Temp src Pulse Resp SpO2  07/20/24 1830 -- -- -- -- -- 99 %  07/20/24 1826 105/70 98.3 F (36.8 C) Oral 86 16 --     Constitutional: Well-developed, well-nourished female in no acute distress.  HEENT: atraumatic, normocephalic. Neck has normal ROM. EOM intact. Cardiovascular: normal rate & rhythm, warm and well-perfused Respiratory: normal effort, no problems with respiration noted GI: Abd soft, non-tender, non-distended MSK: Extremities nontender, no edema, normal ROM Skin: warm and dry. Acyanotic, no jaundice or pallor. Neurologic: Alert and oriented x 4. No abnormal coordination. Psychiatric: Normal mood. Speech not slurred, not rapid/pressured. Patient  is cooperative. GU: no CVA tenderness Pelvic exam: deferred  Fetal Tracing: Baseline FHR: 125 per minute Fetal heart variability: moderate Fetal Heart Rate accelerations: yes Fetal Heart Rate decelerations: none Fetal Non-stress Test: Category I (reactive) Toco: uterine contractions every 5-9 minutes  Labs: No results found for this or any previous visit (from the past 24 hours).  Imaging:  No results found.  MDM & MAU COURSE  MDM: High  MAU Course: -Vital signs within normal limits.  -Per Dr. Abigail, will get NST and BPP. If sending home will schedule IOL for later this week. -Reactive NST. -BPP 8/8 with AFI 7.4 in 7th percentile, lower end of normal. - Discussed with Dr. Abigail.  Patient on wait list for  IOL starting 07/24/2024.  Postdates IOL scheduled for 08/03/2024.  Orders Placed This Encounter  Procedures   US  MFM FETAL BPP WO NON STRESS   Discharge patient   No orders of the defined types were placed in this encounter.   ASSESSMENT   1. NST (non-stress test) reactive   2. [redacted] weeks gestation of pregnancy   3. Language barrier     PLAN  Discharge home in stable condition with labor precautions.      Allergies as of 07/20/2024       Reactions   Porcine (pork) Protein-containing Drug Products    Religious practice        Medication List     TAKE these medications    ACCRUFeR  30 MG Caps Generic drug: Ferric Maltol  Take 1 capsule (30 mg total) by mouth daily.   Prenatal Complete 14-0.4 MG Tabs Take 1 tablet by mouth daily.   triamcinolone  cream 0.1 % Commonly known as: KENALOG  Apply 1 Application topically 2 (two) times daily.        Joesph DELENA Sear, PA

## 2024-07-21 ENCOUNTER — Other Ambulatory Visit: Payer: Self-pay

## 2024-07-21 ENCOUNTER — Inpatient Hospital Stay
Admission: EM | Admit: 2024-07-21 | Discharge: 2024-07-22 | DRG: 807 | Disposition: A | Discharge status: Home / Self Care

## 2024-07-21 ENCOUNTER — Encounter: Payer: Self-pay | Admitting: Obstetrics and Gynecology

## 2024-07-21 DIAGNOSIS — Z833 Family history of diabetes mellitus: Secondary | ICD-10-CM | POA: Diagnosis not present

## 2024-07-21 DIAGNOSIS — Z603 Acculturation difficulty: Secondary | ICD-10-CM | POA: Diagnosis present

## 2024-07-21 DIAGNOSIS — O99019 Anemia complicating pregnancy, unspecified trimester: Secondary | ICD-10-CM | POA: Diagnosis present

## 2024-07-21 DIAGNOSIS — Z758 Other problems related to medical facilities and other health care: Secondary | ICD-10-CM | POA: Diagnosis present

## 2024-07-21 DIAGNOSIS — O9902 Anemia complicating childbirth: Secondary | ICD-10-CM | POA: Diagnosis present

## 2024-07-21 DIAGNOSIS — Z3A39 39 weeks gestation of pregnancy: Secondary | ICD-10-CM | POA: Diagnosis not present

## 2024-07-21 DIAGNOSIS — O26893 Other specified pregnancy related conditions, third trimester: Secondary | ICD-10-CM | POA: Diagnosis present

## 2024-07-21 DIAGNOSIS — Z8619 Personal history of other infectious and parasitic diseases: Secondary | ICD-10-CM | POA: Diagnosis present

## 2024-07-21 DIAGNOSIS — O99824 Streptococcus B carrier state complicating childbirth: Secondary | ICD-10-CM | POA: Diagnosis present

## 2024-07-21 DIAGNOSIS — Z8249 Family history of ischemic heart disease and other diseases of the circulatory system: Secondary | ICD-10-CM | POA: Diagnosis not present

## 2024-07-21 DIAGNOSIS — Z3480 Encounter for supervision of other normal pregnancy, unspecified trimester: Secondary | ICD-10-CM

## 2024-07-21 DIAGNOSIS — R8271 Bacteriuria: Secondary | ICD-10-CM | POA: Diagnosis present

## 2024-07-21 DIAGNOSIS — O479 False labor, unspecified: Principal | ICD-10-CM

## 2024-07-21 LAB — CBC
HCT: 34.2 % — ABNORMAL LOW (ref 36.0–46.0)
Hemoglobin: 11.2 g/dL — ABNORMAL LOW (ref 12.0–15.0)
MCH: 27.3 pg (ref 26.0–34.0)
MCHC: 32.7 g/dL (ref 30.0–36.0)
MCV: 83.4 fL (ref 80.0–100.0)
Platelets: 228 K/uL (ref 150–400)
RBC: 4.1 MIL/uL (ref 3.87–5.11)
RDW: 13.7 % (ref 11.5–15.5)
WBC: 6.7 K/uL (ref 4.0–10.5)
nRBC: 0 % (ref 0.0–0.2)

## 2024-07-21 LAB — TYPE AND SCREEN
ABO/RH(D): A POS
Antibody Screen: NEGATIVE

## 2024-07-21 MED ORDER — SODIUM CHLORIDE 0.9 % IV SOLN: 250.0000 mL | INTRAVENOUS | Status: DC | PRN

## 2024-07-21 MED ORDER — IBUPROFEN 600 MG PO TABS
600.0000 mg | ORAL_TABLET | Freq: Four times a day (QID) | ORAL | Status: DC
  Administered 2024-07-21 – 2024-07-22 (×4): 600 mg via ORAL
  Filled 2024-07-21 (×4): qty 1

## 2024-07-21 MED ORDER — FENTANYL CITRATE (PF) 100 MCG/2ML IJ SOLN
50.0000 ug | INTRAMUSCULAR | Status: DC | PRN
  Administered 2024-07-21: 100 ug via INTRAVENOUS
  Filled 2024-07-21: qty 2

## 2024-07-21 MED ORDER — ACETAMINOPHEN 500 MG PO TABS: 1000.0000 mg | ORAL_TABLET | Freq: Four times a day (QID) | ORAL | Status: DC | PRN

## 2024-07-21 MED ORDER — SOD CITRATE-CITRIC ACID 500-334 MG/5ML PO SOLN: 30.0000 mL | ORAL | Status: DC | PRN

## 2024-07-21 MED ORDER — ONDANSETRON HCL 4 MG/2ML IJ SOLN: 4.0000 mg | INTRAMUSCULAR | Status: DC | PRN

## 2024-07-21 MED ORDER — TETANUS-DIPHTH-ACELL PERTUSSIS 5-2-15.5 LF-MCG/0.5 IM SUSP: 0.5000 mL | Freq: Once | INTRAMUSCULAR | Status: DC

## 2024-07-21 MED ORDER — LIDOCAINE HCL (PF) 1 % IJ SOLN
INTRAMUSCULAR | Status: AC
  Filled 2024-07-21: qty 30

## 2024-07-21 MED ORDER — LACTATED RINGERS IV SOLN: 500.0000 mL | INTRAVENOUS | Status: DC | PRN

## 2024-07-21 MED ORDER — SODIUM CHLORIDE 0.9% FLUSH: 3.0000 mL | Freq: Two times a day (BID) | INTRAVENOUS | Status: DC

## 2024-07-21 MED ORDER — DIBUCAINE (PERIANAL) 1 % EX OINT: 1.0000 | TOPICAL_OINTMENT | CUTANEOUS | Status: DC | PRN

## 2024-07-21 MED ORDER — PRENATAL MULTIVITAMIN CH: 1.0000 | ORAL_TABLET | Freq: Every day | ORAL | Status: DC

## 2024-07-21 MED ORDER — LACTATED RINGERS IV SOLN: INTRAVENOUS | Status: DC

## 2024-07-21 MED ORDER — SODIUM CHLORIDE 0.9% FLUSH: 3.0000 mL | INTRAVENOUS | Status: DC | PRN

## 2024-07-21 MED ORDER — BENZOCAINE-MENTHOL 20-0.5 % EX AERO
1.0000 | INHALATION_SPRAY | CUTANEOUS | Status: DC | PRN
  Administered 2024-07-21: 1 via TOPICAL
  Filled 2024-07-21: qty 56

## 2024-07-21 MED ORDER — ONDANSETRON HCL 4 MG PO TABS: 4.0000 mg | ORAL_TABLET | ORAL | Status: DC | PRN

## 2024-07-21 MED ORDER — SENNOSIDES-DOCUSATE SODIUM 8.6-50 MG PO TABS
2.0000 | ORAL_TABLET | Freq: Every day | ORAL | Status: DC
  Administered 2024-07-22: 2 via ORAL
  Filled 2024-07-21: qty 2

## 2024-07-21 MED ORDER — DIPHENHYDRAMINE HCL 25 MG PO CAPS: 25.0000 mg | ORAL_CAPSULE | Freq: Four times a day (QID) | ORAL | Status: DC | PRN

## 2024-07-21 MED ORDER — OXYTOCIN 10 UNIT/ML IJ SOLN
INTRAMUSCULAR | Status: AC
  Filled 2024-07-21: qty 2

## 2024-07-21 MED ORDER — SODIUM CHLORIDE 0.9 % IV SOLN: 1.0000 g | INTRAVENOUS | Status: DC

## 2024-07-21 MED ORDER — WITCH HAZEL-GLYCERIN EX PADS
1.0000 | MEDICATED_PAD | CUTANEOUS | Status: DC | PRN
  Administered 2024-07-21: 1 via TOPICAL
  Filled 2024-07-21: qty 100

## 2024-07-21 MED ORDER — COCONUT OIL OIL: 1.0000 | TOPICAL_OIL | Status: DC | PRN

## 2024-07-21 MED ORDER — ONDANSETRON HCL 4 MG/2ML IJ SOLN: 4.0000 mg | Freq: Four times a day (QID) | INTRAMUSCULAR | Status: DC | PRN

## 2024-07-21 MED ORDER — OXYTOCIN-SODIUM CHLORIDE 30-0.9 UT/500ML-% IV SOLN
INTRAVENOUS | Status: AC
  Filled 2024-07-21: qty 500

## 2024-07-21 MED ORDER — ACETAMINOPHEN 500 MG PO TABS
1000.0000 mg | ORAL_TABLET | Freq: Four times a day (QID) | ORAL | Status: DC
  Administered 2024-07-21 – 2024-07-22 (×4): 1000 mg via ORAL
  Filled 2024-07-21 (×4): qty 2

## 2024-07-21 MED ORDER — SIMETHICONE 80 MG PO CHEW: 80.0000 mg | CHEWABLE_TABLET | ORAL | Status: DC | PRN

## 2024-07-21 MED ORDER — MISOPROSTOL 200 MCG PO TABS
ORAL_TABLET | ORAL | Status: AC
  Filled 2024-07-21: qty 4

## 2024-07-21 MED ORDER — OXYTOCIN-SODIUM CHLORIDE 30-0.9 UT/500ML-% IV SOLN
2.5000 [IU]/h | INTRAVENOUS | Status: DC
  Administered 2024-07-21: 2.5 [IU]/h via INTRAVENOUS
  Filled 2024-07-21: qty 500

## 2024-07-21 MED ORDER — SODIUM CHLORIDE 0.9% FLUSH
3.0000 mL | Freq: Two times a day (BID) | INTRAVENOUS | Status: DC
  Administered 2024-07-21: 3 mL via INTRAVENOUS

## 2024-07-21 MED ORDER — OXYTOCIN BOLUS FROM INFUSION
333.0000 mL | Freq: Once | INTRAVENOUS | Status: AC
  Administered 2024-07-21: 333 mL via INTRAVENOUS

## 2024-07-21 MED ORDER — AMMONIA AROMATIC IN INHA
RESPIRATORY_TRACT | Status: DC
Start: 2024-07-21 — End: 2024-07-21
  Filled 2024-07-21: qty 10

## 2024-07-21 MED ORDER — SODIUM CHLORIDE 0.9 % IV SOLN
2.0000 g | Freq: Once | INTRAVENOUS | Status: AC
  Administered 2024-07-21: 2 g via INTRAVENOUS
  Filled 2024-07-21: qty 2000

## 2024-07-21 MED ORDER — LIDOCAINE HCL (PF) 1 % IJ SOLN: 30.0000 mL | INTRAMUSCULAR | Status: DC | PRN

## 2024-07-21 MED ORDER — ZOLPIDEM TARTRATE 5 MG PO TABS: 5.0000 mg | ORAL_TABLET | Freq: Every evening | ORAL | Status: DC | PRN

## 2024-07-21 MED ORDER — FERROUS SULFATE 325 (65 FE) MG PO TABS
325.0000 mg | ORAL_TABLET | Freq: Two times a day (BID) | ORAL | Status: DC
  Administered 2024-07-21 – 2024-07-22 (×2): 325 mg via ORAL
  Filled 2024-07-21 (×2): qty 1

## 2024-07-21 NOTE — ED Provider Notes (Signed)
   [facility] Provider Note    Event Date/Time   First MD Initiated Contact with Patient 07/21/24 [phone]     (approximate)   History   No chief complaint on file.   HPI  Melanie [name] is a 28 year old female G4P3 at [redacted] weeks gestation presenting with contractions.  Patient reports that a few hours ago she had onset of more frequent contractions, estimates that they are about 3 minutes apart currently.  Patient declined GU evaluation by female EMS team, but they did note some blood in her GU area.      Physical Exam   Triage Vital Signs: ED Triage Vitals  Encounter Vitals Group     BP      Girls Systolic BP Percentile      Girls Diastolic BP Percentile      Boys Systolic BP Percentile      Boys Diastolic BP Percentile      Pulse      Resp      Temp      Temp src      SpO2      Weight      Height      Head Circumference      Peak Flow      Pain Score      Pain Loc      Pain Education      Exclude from Growth Chart     Most recent vital signs: There were no vitals filed for this visit.   General: Awake, interactive, not in acute distress on arrival CV:  Good peripheral perfusion Resp:  Unlabored respirations Abd:  Gravid uterus GU:  Small amount of bright red blood noted externally along vaginal area.  On internal exam with sterile gloves, patient is noted to have cervical dilated estimated greater than 4 cm, but not complete.  Not crowning. Small amount of blood noted on exam. Neuro:  Symmetric facial movement, fluid speech   ED Results / Procedures / Treatments   Labs (all labs ordered are listed, but only abnormal results are displayed) Labs Reviewed - No data to display   EKG EKG independently reviewed and interpreted by myself demonstrates:    RADIOLOGY Imaging independently reviewed and interpreted by myself demonstrates:    PROCEDURES:  Critical Care performed: No  Procedures   MEDICATIONS ORDERED IN  ED: Medications - No data to display   IMPRESSION / MDM / ASSESSMENT AND PLAN / ED COURSE  I reviewed the triage vital signs and the nursing notes.  Differential diagnosis includes, but is not limited to, active labor, precipitous delivery, peripartum hemorrhage  Patient's presentation is most consistent with acute presentation with potential threat to life or bodily function.  28 year old female presenting with contractions, full-term in her pregnancy.  Not in significant discomfort on presentation.  Cervical check reveals dilated cervix, but no crowning and not complete.  Patient taken to labor and delivery for further evaluation and management.     FINAL CLINICAL IMPRESSION(S) / ED DIAGNOSES   Final diagnoses:  Uterine contractions     Rx / DC Orders   ED Discharge Orders     None        Note:  This document was prepared using Dragon voice recognition software and may include unintentional dictation errors.   Levander Slate, MD 07/21/24 1009

## 2024-07-21 NOTE — H&P (Addendum)
 OB History & Physical   Due to language barrier, an interpreter was present during the history-taking and subsequent discussion (and for part of the physical exam) with this patient.  History of Present Illness:   Chief Complaint: contractions   HPI:  [name] [name] is a 28 y.o. [phone] female at [redacted]w[redacted]d, Patient's last menstrual period was 10/21/2023., consistent with US  at [redacted]w[redacted]d, with Estimated Date of Delivery: 07/27/24.  She presents to L&D for contractions.  Her pregnancy is complicated by anemia and GBS bacteruria.  She denies Loss of fluid or Vaginal bleeding, but does report bloody show. Endorses fetal movement as active.   Reports active fetal movement  Contractions: every 3 to 5 minutes starting at 0900 LOF/SROM: denies  Vaginal bleeding: bloody show   Factors complicating pregnancy:  Principal Problem:   Normal labor Active Problems:   GBS bacteriuria   Language barrier   Anemia of mother in pregnancy    [facility]ren[facility] care site:  [facility]   Patient Active Problem List   Diagnosis Date Noted   Normal labor 07/21/2024   Absolute anemia 04/30/2024   Anemia of mother in pregnancy 01/12/2024   Supervision of other normal pregnancy, antepartum 01/11/2024   Language barrier 11/15/2021   GBS bacteriuria 07/28/2021      Maternal Diabetes: No Genetic Screening: Normal Maternal Ultrasounds/Referrals: Normal Fetal Ultrasounds or other Referrals:  None Maternal Substance Abuse:  No Significant Maternal Medications:  None Significant Maternal Lab Results:  Group B Strep positive Number of Prenatal Visits:greater than 3 verified prenatal visits Maternal Vaccinations:RSV: Given during pregnancy >/=14 days ago and TDap Other Comments:  None   Maternal Medical History:   Past Medical History:  Diagnosis Date   Anemia    Depression    loss of her mother   GERD (gastroesophageal reflux disease)    History of anemia 11/23/2021    Palpitations    Seasonal allergies    UTI (urinary tract infection)     Past Surgical History:  Procedure Laterality Date   NO PAST SURGERIES      Allergies  Allergen Reactions   Porcine (Pork) Protein-Containing Drug Products     Religious practice    Prior to Admission medications   Medication Sig Start Date End Date Taking? Authorizing Provider  Ferric Maltol  (ACCRUFER ) 30 MG CAPS Take 1 capsule (30 mg total) by mouth daily. 06/17/24   Lo, Arland POUR, CNM  Prenatal Vit-Fe Fumarate-FA (PRENATAL COMPLETE) 14-0.4 MG TABS Take 1 tablet by mouth daily. 01/26/22   Das, Anuka, MD  triamcinolone  cream (KENALOG ) 0.1 % Apply 1 Application topically 2 (two) times daily. 04/27/24   Cleotilde Ronal RAMAN, MD    OB History  Gravida Para Term Preterm AB Living  4 3 3  0 0 3  SAB IAB Ectopic Multiple Live Births  0 0 0 0 3    # Outcome Date GA Lbr Len/2nd Weight Sex Type Anes PTL Lv  4 Current           3 Term 01/24/22 [redacted]w[redacted]d  2700 g F Vag-Spont None  LIV     Birth Comments: none      Name: [name] [name]     Apgar1: 9  Apgar5: 9  2 Term 07/22/17    F Vag-Spont     1 Term 07/22/15    M Vag-Spont  Y      Social History: She  reports that she has never smoked. She has never used  smokeless tobacco. She reports that she does not drink alcohol and does not use drugs.  Family History: family history includes Diabetes in her father; Heart disease in her father; Hypertension in her father; Liver disease in her mother.   Review of Systems: A full review of systems was performed and negative except as noted in the HPI.     Physical Exam:  Vital Signs: BP 104/71 (BP Location: Right Arm)   Pulse 84   Temp 98.2 F (36.8 C) (Oral)   Resp 18   LMP 10/21/2023   General: no acute distress.  HEENT: normocephalic, atraumatic Heart: regular rate & rhythm Lungs: normal respiratory effort Abdomen: soft, gravid, non-tender;  EFW: 6 1/2 lbs  Pelvic:   External: Normal external female  genitalia  Cervix: Dilation: 6.5 / Effacement (%): 70 / Station: -2    Extremities: non-tender, symmetric, no edema bilaterally.  DTRs: 2+/2+  Neurologic: Alert & oriented x 3.    No results found for this or any previous visit (from the past 24 hours).  Pertinent Results:  Prenatal Labs: Blood type/Rh A POS   Antibody screen Negative    Rubella 2.16 (04/07 1605)   Varicella Unknown  RPR NON REACTIVE (09/09 2043)   HBsAg Negative (04/07 1605)  Hep C NR   HIV NON REACTIVE (09/09 2043)   GC neg  Chlamydia neg  Genetic screening cfDNA negative   2 hour GTT 67, 79, 106  GBS Positive     FHT:  FHR: 130 bpm, variability: moderate,  accelerations:  Present,  decelerations:  Absent Category/reactivity:  Category I UC:   regular, every 3-5 minutes   Cephalic by Leopolds and SVE   US  MFM FETAL BPP WO NON STRESS Result Date: 07/20/2024 ----------------------------------------------------------------------  OBSTETRICS REPORT                       (Signed Final 07/20/2024 10:50 pm) ---------------------------------------------------------------------- Patient Info  ID #:       [id number]                          D.O.B.:  07/13/1996 (28 yrs)(F)  Name:       [name] [name] [facility] Melanie            Visit Date: 07/20/2024 08:20 pm              [name] ---------------------------------------------------------------------- Performed By  Attending:        Fredia Fresh MD        Referred By:       [facility] MAU/Triage  Performed By:     Mardy Heller          [facility] ---------------------------------------------------------------------- Orders  #  Description                           Code        Ordered By  1  US  MFM FETAL BPP WO NON  23180.98    MORGAN EDSTROM     STRESS ----------------------------------------------------------------------  #  Order #                     Accession #                Episode  #  1  [id number]                   [id number]                 [id number] ---------------------------------------------------------------------- Indications  Oligohydramnios / Decreased amniotic fluid      O41.00X0  volume  [redacted] weeks gestation of pregnancy                 Z3A.39 ---------------------------------------------------------------------- Fetal Evaluation  Num Of Fetuses:          1  Fetal Heart Rate(bpm):   180  Cardiac Activity:        Observed  Presentation:            Cephalic  Placenta:                Anterior  P. Cord Insertion:       Visualized, central  Amniotic Fluid  AFI FV:      Lower end of normal  AFI Sum(cm)     %Tile       Largest Pocket(cm)  7.4             7           3.5  RUQ(cm)                     LUQ(cm)        LLQ(cm)  3.5                         3              0.9  Comment:    Stomach, bladder, and diaphragm noted. ---------------------------------------------------------------------- Biophysical Evaluation  Amniotic F.V:   Pocket => 2 cm             F. Tone:         Observed  F. Movement:    Observed                   Score:           8/8  F. Breathing:   Observed ---------------------------------------------------------------------- OB History  Gravidity:    4         Term:   3 ---------------------------------------------------------------------- Gestational Age  LMP:           39w 0d        Date:  10/21/23                  EDD:   07/27/24  Best:          39w 0d     Det. By:  LMP  (10/21/23)          EDD:   07/27/24 ---------------------------------------------------------------------- Impression  Patient had nonreactive NST and low amniotic fluid at  prenatal visit today.  Amniotic fluid is normal. Cephalic presentation. BPP 8/8. ----------------------------------------------------------------------                  Melanie Fresh, MD Electronically Signed Final Report   07/20/2024 10:50 pm ----------------------------------------------------------------------    Assessment:  [name]  Lashauna [name] is a 28 y.o. [phone] female at [redacted]w[redacted]d with normal labor .   Plan:  1. Admit to Labor & Delivery - Admission status: Inpatient - Dr. CANDIE Mace MD notified of admission and plan of care  - Reason for admission: labor management - consents reviewed and obtained  2. Fetal Well being  - Fetal Tracing: Cat 1 - Group B Streptococcus ppx indicated: GBS positive - Presentation: cephalic confirmed by SVE  - US  done 07/20/2024 confirms cephalic presentation   3. Routine OB: - Prenatal labs reviewed, as above - Rh positive - CBC, T&S, RPR on admit - Labor diet, saline lock  4. Monitoring of labor  - Contractions monitored with external toco - Pelvis  adequate for trial of labor  - Plan for expectant management  - Augmentation with oxytocin  and AROM as appropriate  - Plan for  continuous fetal monitoring - Maternal pain control as desired; planning IVPM and unmedicated labor support options  - Anticipate vaginal delivery  5. Post Partum Planning: - Infant feeding: breast and formula feeding - Contraception: IUD - Flu vaccine: declined  - Tdap vaccine: Given prenatally - RSV vaccine: Given during pregnancy >/=14 days ago  Therisa CHRISTELLA Pillow, EDDY 07/21/24 10:16 AM  Therisa Pillow, CNM Certified Nurse Midwife [facility]

## 2024-07-21 NOTE — Discharge Summary (Signed)
 Postpartum Discharge Summary  Patient Name: Melanie [name] DOB: 03/07/1996 MRN: [id number]  Date of admission: 07/21/2024 Delivery date:07/21/2024 Delivering provider: MACKIE, ANNA M Date of discharge: 07/22/2024  Primary [facility]: [facility] OFE:Ejupzwu'd last menstrual period was 10/21/2023. EDC Estimated Date of Delivery: 07/27/24 Gestational Age at Delivery: [redacted]w[redacted]d   Admitting diagnosis: Normal labor [O80, Z37.9] Intrauterine pregnancy: [redacted]w[redacted]d     Secondary diagnosis:   Principal Problem:   Normal labor Active Problems:   GBS bacteriuria   Language barrier   Anemia of mother in pregnancy   NSVD (normal spontaneous vaginal delivery)  Discharge Diagnosis: Term Pregnancy Delivered      Hospital course: Onset of Labor With Vaginal Delivery      28 y.o. yo [id number] at [redacted]w[redacted]d was admitted in Active Labor on 07/21/2024. Labor course was uncomplicated. She was expectantly managed. She progressed well to C/C/+2 with a strong urge to push after SROM.  She pushed quickly over approximately 10 minutes for a spontaneous vaginal birth.    Membrane Rupture Time/Date: 11:21 AM,07/21/2024  Delivery Method:Vaginal, Spontaneous Operative Delivery:N/A Episiotomy: None Lacerations:  None Patient had a postpartum course without complication.  She is ambulating, tolerating a regular diet, passing flatus, and urinating well. Patient is discharged home in stable condition on 07/22/24.  Newborn Data: Birth date:07/21/2024 Birth time:12:37 PM Gender:Female Afnan Living status:Living Apgars:8 ,9  Weight:3000 g                                            Post partum procedures:n/a Augmentation:: N/A Complications: None Delivery Type: spontaneous vaginal delivery Anesthesia: IV narcotics Placenta: spontaneous To Pathology: No   Prenatal Labs:  Blood type/Rh A POS   Antibody screen Negative    Rubella 2.16 (04/07 1605)   Varicella Unknown - collected 07/21/2024  RPR NON  REACTIVE (09/09 2043)   HBsAg Negative (04/07 1605)  Hep C NR   HIV NON REACTIVE (09/09 2043)   GC neg  Chlamydia neg  Genetic screening cfDNA negative   2 hour GTT 67, 79, 106  GBS Positive  - Amp x 1    Magnesium Sulfate received: No BMZ received: No Rhophylac:was not indicated MMR: was not indicated Varivax vaccine given: was offered Tdap vaccine: Given prenatally Flu vaccine: declined  RSV vaccine:RSV Given: Given during pregnancy >/=14 days ago  Transfusion:No  Physical exam  Vitals:   07/21/24 2343 07/22/24 0315 07/22/24 0846 07/22/24 1201  BP: 109/67 102/73 103/67 100/61  Pulse: (!) 58 (!) 56 66 67  Resp: 16 18 18 18   Temp: 98.3 F (36.8 C) 98.2 F (36.8 C) 98.4 F (36.9 C) 98.5 F (36.9 C)  TempSrc: Oral Oral Oral Oral  SpO2: 100% 100% 100%   Weight:      Height:       General: alert, cooperative, and no distress Lochia: appropriate Uterine Fundus: firm Perineum: minimal edema/intact DVT Evaluation: No evidence of DVT seen on physical exam.  Labs: Lab Results  Component Value Date   WBC 9.5 07/22/2024   HGB 10.9 (L) 07/22/2024   HCT 33.7 (L) 07/22/2024   MCV 85.8 07/22/2024   PLT 215 07/22/2024      Latest Ref Rng & Units 05/31/2024    4:46 PM  CMP  Glucose 70 - 99 mg/dL 70   BUN 6 - 20 mg/dL 6   Creatinine 9.42 - 8.99  mg/dL 9.58   Sodium 865 - 855 mmol/L 136   Potassium 3.5 - 5.2 mmol/L 3.8   Chloride 96 - 106 mmol/L 105   CO2 20 - 29 mmol/L 18   Calcium 8.7 - 10.2 mg/dL 8.7   Total Protein 6.0 - 8.5 g/dL 6.0   Total Bilirubin 0.0 - 1.2 mg/dL <9.7   Alkaline Phos 44 - 121 IU/L 102   AST 0 - 40 IU/L 17   ALT 0 - 32 IU/L 7    Edinburgh Score:    07/21/2024    9:45 PM  Edinburgh Postnatal Depression Scale Screening Tool  I have been able to laugh and see the funny side of things. 3  I have looked forward with enjoyment to things. 1  I have blamed myself unnecessarily when things went wrong. 0  I have been anxious or worried for no  good reason. 0  I have felt scared or panicky for no good reason. 0  Things have been getting on top of me. 0  I have been so unhappy that I have had difficulty sleeping. 0  I have felt sad or miserable. 0  I have been so unhappy that I have been crying. 0  The thought of harming myself has occurred to me. 0  Edinburgh Postnatal Depression Scale Total 4     Postpartum VTE Prophylaxis  Recommend 6 weeks of prophylactic anticoagulation with LMWH or subcutaneous unfractionated heparin if 1 or more high risk factor is present.  Recommend 14 days of prophylactic anticoagulation with LMWH or subcutaneous unfractionated heparin if 3 or more moderate risk factors are present.   Risk assessment for postpartum VTE and prophylactic treatment: High risk factors: None Moderate risk factors: None  Postpartum VTE prophylaxis with LMWH not indicated    After visit meds:  Allergies as of 07/22/2024       Reactions   Porcine (pork) Protein-containing Drug Products    Religious practice        Medication List     STOP taking these medications    ACCRUFeR  30 MG Caps Generic drug: Ferric Maltol    triamcinolone  cream 0.1 % Commonly known as: KENALOG        TAKE these medications    acetaminophen  500 MG tablet Commonly known as: TYLENOL  Take 2 tablets (1,000 mg total) by mouth every 6 (six) hours as needed for mild pain (pain score 1-3) or fever.   benzocaine -Menthol  20-0.5 % Aero Commonly known as: DERMOPLAST Apply 1 Application topically as needed for irritation (perineal discomfort).   ferrous sulfate  325 (65 FE) MG tablet Take 1 tablet (325 mg total) by mouth 2 (two) times daily with a meal.   ibuprofen  600 MG tablet Commonly known as: ADVIL  Take 1 tablet (600 mg total) by mouth every 6 (six) hours as needed for fever, mild pain (pain score 1-3) or cramping.   Prenatal Complete 14-0.4 MG Tabs Take 1 tablet by mouth daily.   senna-docusate 8.6-50 MG tablet Commonly known  as: Senokot-S Take 2 tablets by mouth at bedtime as needed for mild constipation.   simethicone  80 MG chewable tablet Commonly known as: MYLICON Chew 1 tablet (80 mg total) by mouth as needed for flatulence.   witch hazel-glycerin  pad Commonly known as: TUCKS Apply 1 Application topically as needed for hemorrhoids.       Discharge home in stable condition Infant Feeding: Breast Infant Disposition:home with mother Discharge instruction: per After Visit Summary and Postpartum booklet. Activity: Advance as tolerated. Pelvic  rest for 6 weeks.  Diet: routine diet Anticipated Birth Control:  Contraceptives: Mirena IUD Postpartum Appointment:6 weeks Additional Postpartum F/U: none Future Appointments: No future appointments.  Follow up Visit:  Follow-up Information     Cleotilde Ronal RAMAN, MD. Schedule an appointment as soon as possible for a visit in 6 week(s).   Specialty: Obstetrics and Gynecology Why: postpartum visit, IUD placement Contact information: [address] [place] [place] [address] [phone]                 Plan:  Robyn Galati was discharged to home in good condition. Follow-up appointment as directed.    Signed:  Edsel Charlies Blush, CNM 07/22/2024 12:59 PM

## 2024-07-22 LAB — CBC
HCT: 33.7 % — ABNORMAL LOW (ref 36.0–46.0)
Hemoglobin: 10.9 g/dL — ABNORMAL LOW (ref 12.0–15.0)
MCH: 27.7 pg (ref 26.0–34.0)
MCHC: 32.3 g/dL (ref 30.0–36.0)
MCV: 85.8 fL (ref 80.0–100.0)
Platelets: 215 K/uL (ref 150–400)
RBC: 3.93 MIL/uL (ref 3.87–5.11)
RDW: 13.8 % (ref 11.5–15.5)
WBC: 9.5 K/uL (ref 4.0–10.5)
nRBC: 0 % (ref 0.0–0.2)

## 2024-07-22 LAB — RPR: RPR Ser Ql: NONREACTIVE

## 2024-07-22 LAB — VARICELLA ZOSTER ANTIBODY, IGG: Varicella IgG: REACTIVE

## 2024-07-22 MED ORDER — SIMETHICONE 80 MG PO CHEW: 80.0000 mg | CHEWABLE_TABLET | ORAL | 0 refills | Status: AC | PRN

## 2024-07-22 MED ORDER — ACETAMINOPHEN 500 MG PO TABS: 1000.0000 mg | ORAL_TABLET | Freq: Four times a day (QID) | ORAL | 0 refills | Status: AC | PRN

## 2024-07-22 MED ORDER — FERROUS SULFATE 325 (65 FE) MG PO TABS: 325.0000 mg | ORAL_TABLET | Freq: Two times a day (BID) | ORAL | 3 refills | Status: AC

## 2024-07-22 MED ORDER — BENZOCAINE-MENTHOL 20-0.5 % EX AERO: 1.0000 | INHALATION_SPRAY | CUTANEOUS | 0 refills | Status: AC | PRN

## 2024-07-22 MED ORDER — SENNOSIDES-DOCUSATE SODIUM 8.6-50 MG PO TABS: 2.0000 | ORAL_TABLET | Freq: Every evening | ORAL | 0 refills | Status: AC | PRN

## 2024-07-22 MED ORDER — IBUPROFEN 600 MG PO TABS: 600.0000 mg | ORAL_TABLET | Freq: Four times a day (QID) | ORAL | 0 refills | Status: AC | PRN

## 2024-07-22 MED ORDER — WITCH HAZEL-GLYCERIN EX PADS: 1.0000 | MEDICATED_PAD | CUTANEOUS | 0 refills | Status: AC | PRN

## 2024-07-22 NOTE — Progress Notes (Signed)
 Pt discharged with infant.  Discharge instructions, prescriptions and follow up appointment given to and reviewed with pt. Pt verbalized understanding. Escorted out by auxillary.

## 2024-07-22 NOTE — Lactation Note (Signed)
 This note was copied from a baby's chart. Lactation Consultation Note  Patient Name: Melanie [name] Today's Date: 07/22/2024 Age:30 hours Reason for consult: Initial assessment;Term;Other (Comment) (Discharge Education)   Maternal Data Does the patient have breastfeeding experience prior to this delivery?: Yes How long did the patient breastfeed?: 2 months  Initial assessment w/ a 22hr old baby Melanie and mom.  This was a SVD. Patient w/ a hx of anemia.  No other major factors affecting lactation.  Patient stated that she only breastfed her other children for 2months.  She does not have a pump at home and was informed to call insurance.    Feeding Mother's Current Feeding Choice: Breast Milk  MOB independently latched infant to the breast.  Infant has a wide open gape and actively fed at the breast.  LATCH Score Latch: Grasps breast easily, tongue down, lips flanged, rhythmical sucking.  Audible Swallowing: None  Type of Nipple: Everted at rest and after stimulation  Comfort (Breast/Nipple): Soft / non-tender  Hold (Positioning): No assistance needed to correctly position infant at breast.  LATCH Score: 8   Lactation Tools Discussed/Used Lactation will return at a later time to help mom call insurance to order a breastpump.  Interventions Interventions: Breast feeding basics reviewed;Education;CDC milk storage guidelines  LC provided education on the following;  milk production expectations, hunger cues, day 1/2 wet/dirty diapers, hand expression, cluster feeding, benefits of STS and arousing infant for a feeding.  Lactation informed patient of feeding infant at least 8 or more times w/in a 24hr period but not exceeding 3hrs. Patient verbalized understanding.   ** LC provided a print out of education that was in Arabic for mom.**   Discharge Discharge Education: Engorgement and breast care;Outpatient recommendation Pump: Advised to call insurance  company  Education on engorgement prevention/treatment was discussed as well as breastmilk storage guidelines.  LC provided patient with a handout on breastmilk storage guidelines from [facility]. [facility] outpatient lactation services phone number written on the white board in the room.  Patient verbalized understanding.  LC also provided education from the postpartum book about warning signs to look for in a poor feeding.    Consult Status Consult Status: Complete    Ricky RAMAN Melanie [name] 07/22/2024, 11:44 AM

## 2024-07-22 NOTE — Lactation Note (Signed)
 This note was copied from a baby's chart. Lactation Consultation Note  Patient Name: Melanie [name] Date: 07/22/2024 Age:30 hours     Maternal Data Lactation to room to provide mom with a copy of the Medicaid form with [facility] to help them with ordering a pump.  Provided instructions on how to reach out for a breastpump.        Melanie [name] S Melanie [name] 07/22/2024, 3:42 PM

## 2024-07-25 ENCOUNTER — Telehealth (HOSPITAL_BASED_OUTPATIENT_CLINIC_OR_DEPARTMENT_OTHER): Payer: Self-pay

## 2024-07-25 NOTE — Telephone Encounter (Signed)
-----   Message from Ronal GORMAN Pinal sent at 07/21/2024  6:11 PM EDT ----- Regarding: pt delivered 10/16 Melanie [name], Pt delivered today 10/16.  Needs 6 week post partum appt.  I cancelled the other ob appts that were scheduled and the induction.    She will not be discharged until over the weekend.  Thank you.  Elvie

## 2024-07-25 NOTE — Telephone Encounter (Signed)
 Spoke with patient via interpreter services. Appointment scheduled for 09/05/2024 at 8:55 am with Dr.Miller. Patient is agreeable to date and time.

## 2024-07-27 ENCOUNTER — Encounter (HOSPITAL_BASED_OUTPATIENT_CLINIC_OR_DEPARTMENT_OTHER): Payer: Self-pay | Admitting: Obstetrics & Gynecology

## 2024-08-03 ENCOUNTER — Inpatient Hospital Stay (HOSPITAL_COMMUNITY)

## 2024-08-10 ENCOUNTER — Inpatient Hospital Stay (HOSPITAL_COMMUNITY)

## 2024-09-05 ENCOUNTER — Encounter (HOSPITAL_BASED_OUTPATIENT_CLINIC_OR_DEPARTMENT_OTHER): Payer: Self-pay | Admitting: Obstetrics & Gynecology

## 2024-09-05 ENCOUNTER — Other Ambulatory Visit (HOSPITAL_COMMUNITY)
Admission: RE | Admit: 2024-09-05 | Discharge: 2024-09-05 | Disposition: A | Source: Ambulatory Visit | Attending: Obstetrics & Gynecology | Admitting: Obstetrics & Gynecology

## 2024-09-05 ENCOUNTER — Ambulatory Visit (HOSPITAL_BASED_OUTPATIENT_CLINIC_OR_DEPARTMENT_OTHER): Admitting: Obstetrics & Gynecology

## 2024-09-05 DIAGNOSIS — Z30430 Encounter for insertion of intrauterine contraceptive device: Secondary | ICD-10-CM

## 2024-09-05 DIAGNOSIS — Z124 Encounter for screening for malignant neoplasm of cervix: Secondary | ICD-10-CM

## 2024-09-05 MED ORDER — LEVONORGESTREL 20 MCG/DAY IU IUD
1.0000 | INTRAUTERINE_SYSTEM | Freq: Once | INTRAUTERINE | Status: AC
  Administered 2024-09-05: 1 via INTRAUTERINE

## 2024-09-05 MED ORDER — PRENATAL COMPLETE 14-0.4 MG PO TABS: 1.0000 | ORAL_TABLET | Freq: Every day | ORAL | 3 refills | Status: AC

## 2024-09-05 NOTE — Progress Notes (Signed)
 Post Partum Visit Note  Melanie [name] is a 28 y.o. [phone] female who presents for a postpartum visit. She is 6 weeks postpartum following a normal spontaneous vaginal delivery.  I have fully reviewed the prenatal and intrapartum course. The delivery was at 39 gestational weeks and 1 day.  Anesthesia: none. Postpartum course has been good. Baby is doing well. Baby is feeding by breast and bottle feeding. Using Similac.  Bleeding thin lochia and pink. Started 4 days ago and this is feeling like a normal cycle.  Was taking POPs but wants to have Mirean IUD placed today.  Bowel function is normal. Bladder function is normal. Patient is sexually active. Contraception method is none. Postpartum depression screening: negative.   Health Maintenance Due  Topic Date Due   Hepatitis B Vaccines 19-59 Average Risk (1 of 3 - 19+ 3-dose series) Never done   HPV VACCINES (1 - 3-dose SCDM series) Never done   Influenza Vaccine  Never done   COVID-19 Vaccine (1 - 2025-26 season) Never done   Cervical Cancer Screening (HPV/Pap Cotest)  08/07/2024    The following portions of the patient's history were reviewed and updated as appropriate: allergies, current medications, past family history, past medical history, past social history, past surgical history, and problem list.  Review of Systems Pertinent items noted in HPI and remainder of comprehensive ROS otherwise negative.  Objective:  There were no vitals taken for this visit.   General:  alert and no distress   Breasts:  normal  Lungs: clear to auscultation bilaterally  Heart:  regular rate and rhythm, S1, S2 normal, no murmur, click, rub or gallop  Abdomen: soft, non-tender; bowel sounds normal; no masses,  no organomegaly   Wound N/a  GU exam:  NAEFG, vaginal without lesions, cervix without lesion       Procudure:  consent obtained.  Speculum placed.  Cervix cleansed with betadine x 3.  Anterior lip of cervix grasped with single  toothed tenaculum.  Uterus sounded to 8cm.  Mirena IUD passed to fundus and then withdrawn slightly.  IUD released and introducer removed.  Strings cut to 2.5cm.  Speculum removed.  Pt tolerated procedure well.  Assessment:     Plan:   Essential components of care per ACOG recommendations:  1.  Mood and well being: Patient with negative depression screening today. Reviewed local resources for support.  - Patient tobacco use? No.   - hx of drug use? No.    2. Infant care and feeding:  -Patient currently breastmilk feeding? Yes and with bottle supplementation as well. -Social determinants of health (SDOH) reviewed in EPIC. No concerns.    3. Sexuality, contraception and birth spacing - Patient does not want a pregnancy in the next year.  Desired family size is 5 children.  - Reviewed reproductive life planning. Reviewed contraceptive methods based on pt preferences and effectiveness.  Patient desired IUD or IUS today.   - Discussed birth spacing of 18 months  4. Sleep and fatigue -Encouraged family/partner/community support of 4 hrs of uninterrupted sleep to help with mood and fatigue  5. Physical Recovery  - Discussed patients delivery and complications. She describes her labor as good. - Patient had a Vaginal, no problems at delivery. Patient had a none laceration. Perineal healing reviewed. Patient expressed understanding - Patient has urinary incontinence? No. - Patient is safe to resume physical and sexual activity but advised to wait 1 week after IUD placement  6.  Health Maintenance - Last pap smear  Diagnosis  Date Value Ref Range Status  08/07/2021   Final   - Negative for intraepithelial lesion or malignancy (NILM)   Pap smear done at today's visit.  -Breast Cancer screening indicated? No.   7. Chronic Disease/Pregnancy Condition follow up: anemia.  Post partum hb was 10.9.  Taking oral iron.  Advised to stop after 1 more month.  RX for PNV to pharmacy  - PCP follow  up  Morna LOISE Quale, RN [facility] for [employer], [facility]

## 2024-09-06 LAB — CYTOLOGY - PAP
Comment: NEGATIVE
Diagnosis: NEGATIVE
High risk HPV: NEGATIVE

## 2024-09-07 ENCOUNTER — Ambulatory Visit (HOSPITAL_BASED_OUTPATIENT_CLINIC_OR_DEPARTMENT_OTHER): Payer: Self-pay | Admitting: Obstetrics & Gynecology

## 2024-09-14 ENCOUNTER — Ambulatory Visit (HOSPITAL_BASED_OUTPATIENT_CLINIC_OR_DEPARTMENT_OTHER): Admitting: Certified Nurse Midwife

## 2024-10-25 ENCOUNTER — Ambulatory Visit (HOSPITAL_BASED_OUTPATIENT_CLINIC_OR_DEPARTMENT_OTHER): Admitting: Obstetrics & Gynecology

## 2024-10-25 NOTE — Progress Notes (Unsigned)
" ° °  GYNECOLOGY  VISIT  CC:   No chief complaint on file.   HPI: 29 y.o. G51P4004 Married Other or two or more races female here for IUD recheck.  No LMP recorded.  Past Medical History:  Diagnosis Date   Anemia    Depression    loss of her mother   GERD (gastroesophageal reflux disease)    History of anemia 11/23/2021   Palpitations    Seasonal allergies    UTI (urinary tract infection)     MEDS:  Reviewed in EPIC  ALLERGIES: Porcine (pork) protein-containing drug products  SH:  ***  ROS  PHYSICAL EXAMINATION:    There were no vitals taken for this visit.    General appearance: alert, cooperative and appears stated age Neck: no adenopathy, supple, symmetrical, trachea midline and thyroid {CHL AMB PHY EX THYROID NORM DEFAULT:[phone]::normal to inspection and palpation} CV:  {Exam; heart brief:31539} Lungs:  {pe lungs ob:314451} Breasts: {Exam; breast:13139::normal appearance, no masses or tenderness} Abdomen: soft, non-tender; bowel sounds normal; no masses,  no organomegaly Lymph:  no inguinal LAD noted  Pelvic: External genitalia:  no lesions              Urethra:  normal appearing urethra with no masses, tenderness or lesions              Bartholins and Skenes: normal                 Vagina: {exam; pelvic vaginal:30846}              Cervix: {CHL AMB PHY EX CERVIX NORM DEFAULT:[phone]::no lesions}              Bimanual Exam:  Uterus:  {CHL AMB PHY EX UTERUS NORM [phone]::normal size, contour, position, consistency, mobility, non-tender}              Adnexa: {CHL AMB PHY EX ADNEXA NO MASS DEFAULT:[phone]::no mass, fullness, tenderness}              Rectovaginal: {yes no:[id number]}.  Confirms.              Anus:  normal sphincter tone, no lesions  Chaperone was present for exam.  Assessment/Plan: There are no diagnoses linked to this encounter.  "

## 2024-11-03 ENCOUNTER — Other Ambulatory Visit (HOSPITAL_COMMUNITY)
Admission: RE | Admit: 2024-11-03 | Discharge: 2024-11-03 | Disposition: A | Source: Ambulatory Visit | Attending: Obstetrics & Gynecology | Admitting: Obstetrics & Gynecology

## 2024-11-03 ENCOUNTER — Encounter (HOSPITAL_BASED_OUTPATIENT_CLINIC_OR_DEPARTMENT_OTHER): Payer: Self-pay | Admitting: Obstetrics & Gynecology

## 2024-11-03 ENCOUNTER — Ambulatory Visit (INDEPENDENT_AMBULATORY_CARE_PROVIDER_SITE_OTHER): Admitting: Obstetrics & Gynecology

## 2024-11-03 VITALS — BP 99/65 | HR 87 | Wt 131.0 lb

## 2024-11-03 DIAGNOSIS — N898 Other specified noninflammatory disorders of vagina: Secondary | ICD-10-CM | POA: Insufficient documentation

## 2024-11-03 DIAGNOSIS — R300 Dysuria: Secondary | ICD-10-CM

## 2024-11-03 DIAGNOSIS — Z603 Acculturation difficulty: Secondary | ICD-10-CM

## 2024-11-03 DIAGNOSIS — Z30431 Encounter for routine checking of intrauterine contraceptive device: Secondary | ICD-10-CM | POA: Diagnosis not present

## 2024-11-03 DIAGNOSIS — Z8619 Personal history of other infectious and parasitic diseases: Secondary | ICD-10-CM

## 2024-11-03 NOTE — Progress Notes (Signed)
" ° °  GYNECOLOGY  VISIT  CC:  IUD check   HPI: 29 y.o. G5P4004 Married Other or two or more races female here for IUD recheck. She reports pain during intercourse, slight vaginal bleeding, white vaginal discharge and some vaginal burning as well. She denies itching.  She is having some pain with intercourse.  Denies vaginal bleeding.  She is having burning with urination as well.  LMP: 10/27/2024  Past Medical History:  Diagnosis Date   Anemia    Depression    loss of her mother   GERD (gastroesophageal reflux disease)    History of anemia 11/23/2021   Palpitations    Seasonal allergies    UTI (urinary tract infection)     MEDS:  Reviewed in EPIC  ALLERGIES: Porcine (pork) protein-containing drug products  SH:  married, non smoker  Review of Systems  Constitutional: Negative.   Genitourinary:        Vaginal discharge and irritation    PHYSICAL EXAMINATION:    BP 99/65 (BP Location: Left Arm, Patient Position: Sitting, Cuff Size: Normal)   Pulse 87   Wt 131 lb (59.4 kg)   LMP 10/27/2024   SpO2 100%   Breastfeeding No   BMI 24.75 kg/m     General appearance: alert, cooperative and appears stated age Lymph:  no inguinal LAD noted  Pelvic: External genitalia:  no lesions              Urethra:  normal appearing urethra with no masses, tenderness or lesions              Bartholins and Skenes: normal                 Vagina: normal mucosa without prolapse or lesions              Cervix: no lesions, IUD string noted and 2cm              Bimanual Exam:  Uterus:  normal size, contour, position, consistency, mobility, non-tender              Adnexa: no mass, fullness, tenderness  Chaperone was present for exam.  Assessment/Plan: 1. IUD check up (Primary) - normal exam today  2. Vaginal discharge - will check for vaginitis causes.  Denies STI concerns. - Cervicovaginal ancillary only( [place])  3. Dysuria - Urine Culture  4. Language barrier - translator  present for entire appointment and translated all conversation   "

## 2024-11-07 LAB — CERVICOVAGINAL ANCILLARY ONLY
Bacterial Vaginitis (gardnerella): NEGATIVE
Candida Glabrata: NEGATIVE
Candida Vaginitis: NEGATIVE
Comment: NEGATIVE
Comment: NEGATIVE
Comment: NEGATIVE

## 2024-11-07 LAB — URINE CULTURE

## 2024-11-08 ENCOUNTER — Ambulatory Visit (HOSPITAL_BASED_OUTPATIENT_CLINIC_OR_DEPARTMENT_OTHER): Payer: Self-pay | Admitting: Obstetrics & Gynecology

## 2024-11-08 DIAGNOSIS — R102 Pelvic and perineal pain unspecified side: Secondary | ICD-10-CM

## 2024-11-16 ENCOUNTER — Ambulatory Visit (HOSPITAL_BASED_OUTPATIENT_CLINIC_OR_DEPARTMENT_OTHER): Payer: Self-pay | Admitting: Obstetrics & Gynecology

## 2024-11-16 ENCOUNTER — Other Ambulatory Visit (HOSPITAL_BASED_OUTPATIENT_CLINIC_OR_DEPARTMENT_OTHER)
